# Patient Record
Sex: Male | Born: 1950
Health system: Southern US, Community
[De-identification: ages and names within clinical notes are randomized; demographics above are authoritative.]

## PROBLEM LIST (undated history)

## (undated) DIAGNOSIS — R7303 Prediabetes: Secondary | ICD-10-CM

## (undated) DIAGNOSIS — M1990 Unspecified osteoarthritis, unspecified site: Secondary | ICD-10-CM

## (undated) DIAGNOSIS — K219 Gastro-esophageal reflux disease without esophagitis: Secondary | ICD-10-CM

## (undated) DIAGNOSIS — Z8719 Personal history of other diseases of the digestive system: Secondary | ICD-10-CM

## (undated) DIAGNOSIS — I10 Essential (primary) hypertension: Secondary | ICD-10-CM

## (undated) DIAGNOSIS — I514 Myocarditis, unspecified: Secondary | ICD-10-CM

## (undated) DIAGNOSIS — K635 Polyp of colon: Secondary | ICD-10-CM

## (undated) DIAGNOSIS — K5790 Diverticulosis of intestine, part unspecified, without perforation or abscess without bleeding: Secondary | ICD-10-CM

## (undated) DIAGNOSIS — I219 Acute myocardial infarction, unspecified: Secondary | ICD-10-CM

## (undated) DIAGNOSIS — C801 Malignant (primary) neoplasm, unspecified: Secondary | ICD-10-CM

## (undated) DIAGNOSIS — I340 Nonrheumatic mitral (valve) insufficiency: Secondary | ICD-10-CM

## (undated) HISTORY — PX: TOTAL KNEE ARTHROPLASTY: SHX125

## (undated) HISTORY — PX: ARTHROSCOPY KNEE W/ DRILLING: SUR92

## (undated) HISTORY — PX: CHOLECYSTECTOMY: SHX55

## (undated) HISTORY — PX: CARDIAC CATHETERIZATION: SHX172

## (undated) HISTORY — PX: TONSILLECTOMY: SUR1361

## (undated) HISTORY — PX: OTHER SURGICAL HISTORY: SHX169

## (undated) HISTORY — PX: HERNIA REPAIR: SHX51

---

## 2011-04-08 ENCOUNTER — Emergency Department (HOSPITAL_BASED_OUTPATIENT_CLINIC_OR_DEPARTMENT_OTHER)
Admission: EM | Admit: 2011-04-08 | Discharge: 2011-04-08 | Disposition: A | Discharge status: Home / Self Care | Payer: BC Managed Care – PPO | Attending: Emergency Medicine | Admitting: Emergency Medicine

## 2011-04-08 ENCOUNTER — Emergency Department (INDEPENDENT_AMBULATORY_CARE_PROVIDER_SITE_OTHER): Payer: BC Managed Care – PPO

## 2011-04-08 DIAGNOSIS — K5732 Diverticulitis of large intestine without perforation or abscess without bleeding: Secondary | ICD-10-CM | POA: Insufficient documentation

## 2011-04-08 DIAGNOSIS — R1032 Left lower quadrant pain: Secondary | ICD-10-CM | POA: Insufficient documentation

## 2011-04-08 DIAGNOSIS — I10 Essential (primary) hypertension: Secondary | ICD-10-CM | POA: Insufficient documentation

## 2011-04-08 DIAGNOSIS — Z79899 Other long term (current) drug therapy: Secondary | ICD-10-CM | POA: Insufficient documentation

## 2011-04-08 HISTORY — DX: Essential (primary) hypertension: I10

## 2011-04-08 HISTORY — DX: Diverticulosis of intestine, part unspecified, without perforation or abscess without bleeding: K57.90

## 2011-04-08 HISTORY — DX: Myocarditis, unspecified: I51.4

## 2011-04-08 HISTORY — DX: Polyp of colon: K63.5

## 2011-04-08 LAB — URINALYSIS, ROUTINE W REFLEX MICROSCOPIC
Hgb urine dipstick: NEGATIVE
Specific Gravity, Urine: 1.018 (ref 1.005–1.030)
Urobilinogen, UA: 0.2 mg/dL (ref 0.0–1.0)

## 2011-04-08 LAB — COMPREHENSIVE METABOLIC PANEL
ALT: 23 U/L (ref 0–53)
AST: 19 U/L (ref 0–37)
Albumin: 3.5 g/dL (ref 3.5–5.2)
Calcium: 9.3 mg/dL (ref 8.4–10.5)
GFR calc Af Amer: >90 mL/min (ref >90)
Glucose, Bld: 107 mg/dL — ABNORMAL HIGH (ref 70–99)
Sodium: 137 mEq/L (ref 135–145)
Total Protein: 7.2 g/dL (ref 6.0–8.3)

## 2011-04-08 LAB — CBC
MCV: 79.6 fL (ref 78.0–100.0)
Platelets: 204 10*3/uL (ref 150–400)
RBC: 5.38 MIL/uL (ref 4.22–5.81)
WBC: 6.6 10*3/uL (ref 4.0–10.5)

## 2011-04-08 LAB — DIFFERENTIAL
Eosinophils Relative: 2 % (ref 0–5)
Lymphocytes Relative: 27 % (ref 12–46)
Lymphs Abs: 1.8 10*3/uL (ref 0.7–4.0)
Neutrophils Relative %: 58 % (ref 43–77)

## 2011-04-08 MED ORDER — METRONIDAZOLE 500 MG PO TABS
500.0000 mg | ORAL_TABLET | Freq: Once | ORAL | Status: AC
  Administered 2011-04-08: 500 mg via ORAL
  Filled 2011-04-08: qty 1

## 2011-04-08 MED ORDER — CIPROFLOXACIN HCL 500 MG PO TABS
500.0000 mg | ORAL_TABLET | Freq: Once | ORAL | Status: AC
  Administered 2011-04-08: 500 mg via ORAL
  Filled 2011-04-08: qty 1

## 2011-04-08 MED ORDER — METRONIDAZOLE 500 MG PO TABS: 500.0000 mg | ORAL_TABLET | Freq: Three times a day (TID) | ORAL | Status: AC

## 2011-04-08 MED ORDER — HYDROCODONE-ACETAMINOPHEN 5-325 MG PO TABS: 1.0000 | ORAL_TABLET | Freq: Four times a day (QID) | ORAL | Status: AC | PRN

## 2011-04-08 MED ORDER — CIPROFLOXACIN HCL 500 MG PO TABS: 500.0000 mg | ORAL_TABLET | Freq: Two times a day (BID) | ORAL | Status: AC

## 2011-04-08 MED ORDER — SODIUM CHLORIDE 0.9 % IV SOLN: 999.0000 mL | INTRAVENOUS | Status: DC

## 2011-04-08 NOTE — ED Notes (Signed)
LUQ pain-denies n/v/d,denies urinary s/s-BM "a little bit larger"-last BM this am

## 2011-04-08 NOTE — ED Provider Notes (Signed)
History     CSN: [id number] Arrival date & time: 04/08/2011 11:27 AM   First MD Initiated Contact with Patient 04/08/11 1134      Chief Complaint  Patient presents with  . Abdominal Pain    (Consider location/radiation/quality/duration/timing/severity/associated sxs/prior treatment) Patient is a 60 y.o. male presenting with abdominal pain. The history is provided by the patient.  Abdominal Pain The primary symptoms of the illness include abdominal pain. The primary symptoms of the illness do not include fever, nausea, vomiting, diarrhea or dysuria. The current episode started 2 days ago. The onset of the illness was gradual. The problem has not changed since onset. The abdominal pain is located in the LLQ. The abdominal pain does not radiate. Pain scale: The pain is not present when he is lying still however when he palpates the area  the pain is moderate. Exacerbated by: The pain is not increased with eating.  Symptoms associated with the illness do not include chills, anorexia, diaphoresis, urgency, hematuria, frequency or back pain. Significant associated medical issues do not include inflammatory bowel disease.    Past Medical History  Diagnosis Date  . Hypertension   . Myocarditis     Past Surgical History  Procedure Date  . Hernia repair     No family history on file.  History  Substance Use Topics  . Smoking status: Never Smoker   . Smokeless tobacco: Not on file  . Alcohol Use: No      Review of Systems  Constitutional: Negative for fever, chills and diaphoresis.  Gastrointestinal: Positive for abdominal pain. Negative for nausea, vomiting, diarrhea and anorexia.  Genitourinary: Negative for dysuria, urgency, frequency and hematuria.  Musculoskeletal: Negative for back pain.  All other systems reviewed and are negative.    Allergies  Review of patient's allergies indicates no known allergies.  Home Medications   Current Outpatient Rx  Name Route Sig  Dispense Refill  . ASPIRIN 81 MG PO TABS Oral Take 81 mg by mouth daily.      . ATENOLOL PO Oral Take by mouth.      Marland Kitchen CALCIUM & MAGNESIUM CARBONATES 311-232 MG PO TABS Oral Take 1 tablet by mouth daily.      Marland Kitchen DIAZEPAM PO Oral Take by mouth.      . OMEGA-3 FATTY ACIDS 1000 MG PO CAPS Oral Take 2 g by mouth daily.      . MULTIVITAMIN PO Oral Take by mouth.      . OMEPRAZOLE PO Oral Take by mouth.      . ALTACE PO Oral Take by mouth.      Marland Kitchen VITAMIN B-2 PO Oral Take by mouth.      Marland Kitchen SIMVASTATIN PO Oral Take by mouth.        BP 135/86  Pulse 78  Temp(Src) 98.5 F (36.9 C) (Oral)  Resp 20  Ht 6\' 1"  (1.854 m)  Wt 205 lb (92.987 kg)  BMI 27.05 kg/m2  SpO2 96%  Physical Exam  Nursing note and vitals reviewed. Constitutional: He appears well-developed and well-nourished. No distress.  HENT:  Head: Normocephalic and atraumatic.  Right Ear: External ear normal.  Left Ear: External ear normal.  Eyes: Conjunctivae are normal. Right eye exhibits no discharge. Left eye exhibits no discharge. No scleral icterus.  Neck: Neck supple. No tracheal deviation present.  Cardiovascular: Normal rate, regular rhythm and intact distal pulses.   Pulmonary/Chest: Effort normal and breath sounds normal. No stridor. No respiratory distress. He has no wheezes.  He has no rales.  Abdominal: Soft. Bowel sounds are normal. He exhibits no distension, no fluid wave, no abdominal bruit, no ascites, no pulsatile midline mass and no mass. There is tenderness in the left lower quadrant. There is no rigidity, no rebound, no guarding and no CVA tenderness. No hernia.    Musculoskeletal: He exhibits no edema and no tenderness.  Neurological: He is alert. He has normal strength. No sensory deficit. Cranial nerve deficit:  no gross defecits noted. He exhibits normal muscle tone. He displays no seizure activity. Coordination normal.  Skin: Skin is warm and dry. No rash noted.  Psychiatric: He has a normal mood and affect.      ED Course  Procedures (including critical care time)  Labs Reviewed  COMPREHENSIVE METABOLIC PANEL - Abnormal; Notable for the following:    Glucose, Bld 107 (*)    All other components within normal limits  LIPASE, BLOOD  URINALYSIS, ROUTINE W REFLEX MICROSCOPIC  CBC  DIFFERENTIAL   Ct Abdomen Pelvis Wo Contrast  04/08/2011  *RADIOLOGY REPORT*  Clinical Data: Left lower quadrant pain.  CT ABDOMEN AND PELVIS WITHOUT CONTRAST  Technique:  Multidetector CT imaging of the abdomen and pelvis was performed following the standard protocol without intravenous contrast.  Comparison: none  .  Findings: Mild atelectasis at the left lung base.  No pericardial fluid.  Non-IV contrast images demonstrate no focal hepatic lesion. Gallbladder, pancreas, spleen, adrenal glands, and kidneys are normal.   No ureterolithiasis.  The stomach, small bowel, appendix are normal.  There is pericolonic stranding surrounding the descending colon over a short 6 cm segment (image 55).  There are several diverticula in this region.  Abdominal aorta is normal caliber.  No retroperitoneal lymphadenopathy.  No free fluid the pelvis.  The bladder is normal.  Prostate gland is enlarged.  No pelvic lymphadenopathy. Review of  bone windows demonstrates no aggressive osseous lesions  IMPRESSION:  1.  Findings most consistent with acute non complicated diverticulitis of the descending colon. 2.  Recommend follow up imaging (or screening colonoscopy if not current) to rule out unlikely underlying malignancy.  Original Report Authenticated By: Genevive Bi, M.D.     1. Diverticulitis of colon       MDM  1:38 PM The patient still having persistent pain in the left lower abdomen. He does have a history of diverticulosis without diverticulitis. Renal colic is a possibility as well. A noncontrast CT abdomen pelvis has been ordered for further evaluation  CT scan result suggests uncomplicated diverticulitis. The patient is  comfortable in no acute distress at this time. We'll discharge him home on a course of Cipro and Flagyl.        Celene Kras, MD 04/08/11 [phone]

## 2011-08-06 ENCOUNTER — Encounter: Payer: BC Managed Care – PPO | Admitting: Family Medicine

## 2011-08-06 DIAGNOSIS — Z0289 Encounter for other administrative examinations: Secondary | ICD-10-CM

## 2013-11-09 ENCOUNTER — Emergency Department
Admission: EM | Admit: 2013-11-09 | Discharge: 2013-11-09 | Payer: BC Managed Care – PPO | Source: Home / Self Care | Attending: Emergency Medicine | Admitting: Emergency Medicine

## 2013-11-09 ENCOUNTER — Encounter: Payer: Self-pay | Admitting: Emergency Medicine

## 2013-11-09 DIAGNOSIS — L723 Sebaceous cyst: Secondary | ICD-10-CM

## 2013-11-09 DIAGNOSIS — L089 Local infection of the skin and subcutaneous tissue, unspecified: Secondary | ICD-10-CM

## 2013-11-09 NOTE — ED Provider Notes (Signed)
CSN: [id number]     Arrival date & time 11/09/13  1329 History   First MD Initiated Contact with Patient 11/09/13 1357     Chief Complaint  Patient presents with  . Cyst    back   (Consider location/radiation/quality/duration/timing/severity/associated sxs/prior Treatment) HPI Pt has had cysts on his back for years. In the last 3-4 weeks one has joined another and it has become red and bruised swollen and painful on his upper back. Pt has been taking Doxy for 8 days and has no relief.  Past Medical History  Diagnosis Date  . Hypertension   . Myocarditis   . Diverticulosis   . Colon polyps    Past Surgical History  Procedure Laterality Date  . Hernia repair    . Gynomastia     History reviewed. No pertinent family history. History  Substance Use Topics  . Smoking status: Never Smoker   . Smokeless tobacco: Not on file  . Alcohol Use: No    Review of Systems  Allergies  Review of patient's allergies indicates no known allergies.  Home Medications   Prior to Admission medications   Medication Sig Start Date End Date Taking? Authorizing Provider  aspirin 81 MG tablet Take 81 mg by mouth daily.      Historical Provider, MD  ATENOLOL PO Take by mouth.      Historical Provider, MD  calcium & magnesium carbonates (MYLANTA) 311-232 MG per tablet Take 1 tablet by mouth daily.      Historical Provider, MD  DIAZEPAM PO Take by mouth.      Historical Provider, MD  fish oil-omega-3 fatty acids 1000 MG capsule Take 2 g by mouth daily.      Historical Provider, MD  Multiple Vitamin (MULTIVITAMIN PO) Take by mouth.      Historical Provider, MD  OMEPRAZOLE PO Take by mouth.      Historical Provider, MD  Ramipril (ALTACE PO) Take by mouth.      Historical Provider, MD  Riboflavin (VITAMIN B-2 PO) Take by mouth.      Historical Provider, MD  SIMVASTATIN PO Take by mouth.      Historical Provider, MD   BP 103/68  Pulse 76  Temp(Src) 98.8 F (37.1 C) (Oral)  Ht 5\' 11"  (1.803 m)  Wt  200 lb 4 oz (90.833 kg)  BMI 27.94 kg/m2  SpO2 96% Physical Exam 10 x 10 cm acute, swollen, red, tender sebaceous cyst mid back. It extends very deep. ED Course  Procedures (including critical care time) Labs Review Labs Reviewed - No data to display  Imaging Review No results found.   MDM   1. Infected sebaceous cyst of skin    Huge infected sebaceous cyst mid back. In my medical opinion, this is beyond my scope of surgery, because of the huge size of a sebaceous cyst which extends very deep.  Explained to patient and wife. We arranged for stat referral work in appointment now with Dr Eustaquio Maize Talent at [facility]. (No charge visit here)    Jacqulyn Cane, MD 11/09/13 1427

## 2013-11-09 NOTE — ED Notes (Signed)
Pt has had cysts on his back for years.  In the last 3-4 weeks one has joined another and it has become red and bruised swollen and painful on his upper back.  Pt has been taking Doxy for 8 days and has no relief.

## 2016-08-06 ENCOUNTER — Ambulatory Visit: Payer: Self-pay | Admitting: Surgery

## 2016-08-06 NOTE — H&P (Signed)
Shane Shane Cruz 08/06/2016 11:09 AM Location: [facility] Patient #: [id number] DOB: [birth year] Married / Language: Shane Shane Cruz / Race: White Male  History of Present Illness (Shane Shane Cruz A. Kae Heller MD; 08/06/2016 11:16 AM) Patient words: This is a very nice 66 year old gentleman with a long-standing history of multiple sebaceous cysts. He's got 4 on his back. One of them became inflamed and had to be lanced in 2015. Other than that he's had no procedures for them. They do cause some discomfort. No drainage.  The patient is a 66 year old male.   Past Surgical History Shane Shane Cruz, Shane Shane Cruz; 08/06/2016 11:09 AM) Colon Polyp Removal - Colonoscopy Colon Polyp Removal - Open Open Inguinal Hernia Surgery Right. Oral Surgery  Diagnostic Studies History Shane Shane Cruz, [place]; 08/06/2016 11:09 AM) Colonoscopy 1-5 years ago  Allergies Shane Shane Cruz, Cruz; 08/06/2016 11:12 AM) No Known Drug Allergies 08/06/2016  Medication History (Shane Shane Cruz, Cruz; 08/06/2016 11:14 AM) TraZODone HCl (100MG  Tablet, Oral) Active. Simvastatin (40MG  Tablet, Oral) Active. Ramipril (10MG  Capsule, Oral) Active. Omeprazole (20MG  Capsule DR, Oral) Active. Atenolol (100MG  Tablet, Oral) Active. Aspirin (81MG  Tablet, Oral) Active. Fish Oil (1000MG  Capsule, Oral) Active. Multivitamin Adult (Oral) Active. Medications Reconciled  Social History Shane Shane Cruz, Cruz; 08/06/2016 11:09 AM) Alcohol use Remotely quit alcohol use. Caffeine use Coffee. No drug use Tobacco use Former smoker.  Family History Shane Shane Cruz, Shane Shane Cruz; 08/06/2016 11:09 AM) Arthritis Father, Mother. Colon Cancer Father. Heart Disease Father. Hypertension Father.  Other Problems Shane Shane Cruz, Cruz; 08/06/2016 11:09 AM) Arthritis Gastroesophageal Reflux Disease High blood pressure Hypercholesterolemia Inguinal Hernia     Review of Systems (Shane Shane Cruz; 08/06/2016 11:09 AM) General Not Present- Appetite Loss, Chills, Fatigue, Fever,  Night Sweats, Weight Gain and Weight Loss. Skin Not Present- Change in Wart/Mole, Dryness, Hives, Jaundice, New Lesions, Non-Healing Wounds, Rash and Ulcer. HEENT Present- Ringing in the Ears and Wears glasses/contact lenses. Not Present- Earache, Hearing Loss, Hoarseness, Nose Bleed, Oral Ulcers, Seasonal Allergies, Sinus Pain, Sore Throat, Visual Disturbances and Yellow Eyes. Respiratory Not Present- Bloody sputum, Chronic Cough, Difficulty Breathing, Snoring and Wheezing. Breast Not Present- Breast Mass, Breast Pain, Nipple Discharge and Skin Changes. Cardiovascular Not Present- Chest Pain, Difficulty Breathing Lying Down, Leg Cramps, Palpitations, Rapid Heart Rate, Shortness of Breath and Swelling of Extremities. Gastrointestinal Not Present- Abdominal Pain, Bloating, Bloody Stool, Change in Bowel Habits, Chronic diarrhea, Constipation, Difficulty Swallowing, Excessive gas, Gets full quickly at meals, Hemorrhoids, Indigestion, Nausea, Rectal Pain and Vomiting. Male Genitourinary Not Present- Blood in Urine, Change in Urinary Stream, Frequency, Impotence, Nocturia, Painful Urination, Urgency and Urine Leakage. Musculoskeletal Not Present- Back Pain, Joint Pain, Joint Stiffness, Muscle Pain, Muscle Weakness and Swelling of Extremities. Neurological Not Present- Decreased Memory, Fainting, Headaches, Numbness, Seizures, Tingling, Tremor, Trouble walking and Weakness. Psychiatric Not Present- Anxiety, Bipolar, Change in Sleep Pattern, Depression, Fearful and Frequent crying. Endocrine Not Present- Cold Intolerance, Excessive Hunger, Hair Changes, Heat Intolerance, Hot flashes and New Diabetes. Hematology Not Present- Blood Thinners, Easy Bruising, Excessive bleeding, Gland problems, HIV and Persistent Infections.  Vitals (Shane Shane Cruz; 08/06/2016 11:12 AM) 08/06/2016 11:10 AM Weight: 212.5 lb Height: 72in Body Surface Area: 2.19 m Body Mass Index: 28.82 kg/m  Temp.: 98.62F  Pulse: 76  (Regular)  P.OX: 96% (Room air) BP: 110/68 (Sitting, Left Arm, Standard)      Physical Exam (Shane Shane Cruz A. Kae Heller MD; 08/06/2016 11:18 AM)  General Note: Alert and oriented, well-appearing  Integumentary Note: There are 4 sebaceous cysts along the midline of his back, the largest is  about 4 cm in diameter and the smallest is about one cm diameter  Head and Neck Note: No mass or thyromegaly  Eye Note: Anicteric, extraocular motion intact  Chest and Lung Exam Note: unlabored respirations, symmetrical air entry  Neurologic Note: grossly intact, normal gait  Neuropsychiatric Note: normal mood and affect, appropriate insight    Assessment & Plan (Shane Shane Cruz A. Shane Riches MD; 08/06/2016 11:20 AM)  SEBACEOUS CYST (L72.3) Story: He actually has 4 of them starting at his upper back and going down the midline to his low thoracic region. The largest is about 4 cm in diameter. I recommend that these be excised in 1 trip to the operating room with sedation especially given the size, to minimize the chances of recurrence and maximize the efficiency of the procedures. He would like to know about the financial aspect of this, so I have referred him to our scheduling department to discuss that.

## 2016-12-23 ENCOUNTER — Ambulatory Visit: Payer: Self-pay | Admitting: Surgery

## 2017-01-20 ENCOUNTER — Encounter (HOSPITAL_COMMUNITY)
Admission: RE | Admit: 2017-01-20 | Discharge: 2017-01-20 | Disposition: A | Discharge status: Home / Self Care | Payer: BLUE CROSS/BLUE SHIELD | Source: Ambulatory Visit | Attending: Surgery | Admitting: Surgery

## 2017-01-20 ENCOUNTER — Encounter (HOSPITAL_COMMUNITY): Payer: Self-pay

## 2017-01-20 DIAGNOSIS — K219 Gastro-esophageal reflux disease without esophagitis: Secondary | ICD-10-CM | POA: Diagnosis not present

## 2017-01-20 DIAGNOSIS — L720 Epidermal cyst: Secondary | ICD-10-CM | POA: Diagnosis not present

## 2017-01-20 DIAGNOSIS — Z87891 Personal history of nicotine dependence: Secondary | ICD-10-CM | POA: Diagnosis not present

## 2017-01-20 DIAGNOSIS — Z7982 Long term (current) use of aspirin: Secondary | ICD-10-CM | POA: Diagnosis not present

## 2017-01-20 DIAGNOSIS — E7800 Pure hypercholesterolemia, unspecified: Secondary | ICD-10-CM | POA: Diagnosis not present

## 2017-01-20 DIAGNOSIS — L723 Sebaceous cyst: Secondary | ICD-10-CM | POA: Diagnosis present

## 2017-01-20 DIAGNOSIS — Z79899 Other long term (current) drug therapy: Secondary | ICD-10-CM | POA: Diagnosis not present

## 2017-01-20 DIAGNOSIS — I10 Essential (primary) hypertension: Secondary | ICD-10-CM | POA: Diagnosis not present

## 2017-01-20 HISTORY — DX: Gastro-esophageal reflux disease without esophagitis: K21.9

## 2017-01-20 HISTORY — DX: Nonrheumatic mitral (valve) insufficiency: I34.0

## 2017-01-20 HISTORY — DX: Personal history of other diseases of the digestive system: Z87.19

## 2017-01-20 HISTORY — DX: Unspecified osteoarthritis, unspecified site: M19.90

## 2017-01-20 LAB — BASIC METABOLIC PANEL
Anion gap: 9 (ref 5–15)
BUN: 21 mg/dL — AB (ref 6–20)
CALCIUM: 9.3 mg/dL (ref 8.9–10.3)
CO2: 26 mmol/L (ref 22–32)
CREATININE: 0.99 mg/dL (ref 0.61–1.24)
Chloride: 101 mmol/L (ref 101–111)
GFR calc non Af Amer: >60 mL/min (ref >60)
Glucose, Bld: 123 mg/dL — ABNORMAL HIGH (ref 65–99)
Potassium: 3.6 mmol/L (ref 3.5–5.1)
SODIUM: 136 mmol/L (ref 135–145)

## 2017-01-20 LAB — CBC WITH DIFFERENTIAL/PLATELET
BASOS PCT: 0 %
Basophils Absolute: 0 10*3/uL (ref 0.0–0.1)
EOS ABS: 0.2 10*3/uL (ref 0.0–0.7)
EOS PCT: 4 %
HCT: 42.7 % (ref 39.0–52.0)
HEMOGLOBIN: 14.1 g/dL (ref 13.0–17.0)
Lymphocytes Relative: 27 %
Lymphs Abs: 1.5 10*3/uL (ref 0.7–4.0)
MCH: 26.4 pg (ref 26.0–34.0)
MCHC: 33 g/dL (ref 30.0–36.0)
MCV: 79.8 fL (ref 78.0–100.0)
MONOS PCT: 9 %
Monocytes Absolute: 0.5 10*3/uL (ref 0.1–1.0)
NEUTROS PCT: 60 %
Neutro Abs: 3.4 10*3/uL (ref 1.7–7.7)
PLATELETS: 177 10*3/uL (ref 150–400)
RBC: 5.35 MIL/uL (ref 4.22–5.81)
RDW: 14.7 % (ref 11.5–15.5)
WBC: 5.7 10*3/uL (ref 4.0–10.5)

## 2017-01-20 NOTE — Progress Notes (Signed)
Last cardiology visit 04-29-2018

## 2017-01-20 NOTE — Progress Notes (Signed)
Requested ECHO done 05-20-2015 from Dr. Rennis Golden ,[place]

## 2017-01-20 NOTE — Pre-Procedure Instructions (Signed)
Shane Cruz  01/20/2017      [id number] - [place], [address] [place] [address] Phone: [phone] Fax: [phone]    Your procedure is scheduled on August 24,2018  Friday .  Report to [facility] Admitting at 8:00 A.M.   Call this number if you have problems the morning of surgery:  [phone]   Remember:  Do not eat food or drink liquids after midnight.   Take these medicines the morning of surgery with A SIP OF WATER Atenolol(Tenormin),Omeprazole(Prilosec)  STOP ASPIRIN,ANTIINFLAMATORIES (IBUPROFEN,ALEVE,MOTRIN,ADVIL,GOODY'S POWDERS),HERBAL SUPPLEMENTS,FISH OIL,AND VITAMINS 5-7 DAYS PRIOR TO SURGERY   Do not wear jewelry  Do not wear lotions, powders, or perfumes, or deoderant.  Do not shave 48 hours prior to surgery.  Men may shave face and neck.   Do not bring valuables to the hospital.  [facility] is not responsible for any belongings or valuables.  Contacts, dentures or bridgework may not be worn into surgery.  Leave your suitcase in the car.  After surgery it may be brought to your room.  For patients admitted to the hospital, discharge time will be determined by your treatment team.  Patients discharged the day of surgery will not be allowed to drive home.    Special Instructions: [facility] [place] - Preparing for Surgery  Before surgery, you can play an important role.  Because skin is not sterile, your skin needs to be as free of germs as possible.  You can reduce the number of germs on you skin by washing with CHG (chlorahexidine gluconate) soap before surgery.  CHG is an antiseptic cleaner which kills germs and bonds with the skin to continue killing germs even after washing.  Please DO NOT use if you have an allergy to CHG or antibacterial soaps.  If your skin becomes reddened/irritated stop using the CHG and inform your nurse when you arrive at Short Stay.  Do not shave (including  legs and underarms) for at least 48 hours prior to the first CHG shower.  You may shave your face.  Please follow these instructions carefully:   1.  Shower with CHG Soap the night before surgery and the   morning of Surgery.  2.  If you choose to wash your hair, wash your hair first as usual with your normal shampoo.  3.  After you shampoo, rinse your hair and body thoroughly to remove the  Shampoo.  4.  Use CHG as you would any other liquid soap.  You can apply chg directly  to the skin and wash gently with scrungie or a clean washcloth.  5.  Apply the CHG Soap to your body ONLY FROM THE NECK DOWN.   Do not use on open wounds or open sores.  Avoid contact with your eyes,  ears, mouth and genitals (private parts).  Wash genitals (private parts) with your normal soap.  6.  Wash thoroughly, paying special attention to the area where your surgery will be performed.  7.  Thoroughly rinse your body with warm water from the neck down.  8.  DO NOT shower/wash with your normal soap after using and rinsing o  the CHG Soap.  9.  Pat yourself dry with a clean towel.            10.  Wear clean pajamas.            11.  Place clean sheets on your bed the night  of your first shower and do not sleep with pets.  Day of Surgery  Do not apply any lotions/deodorants the morning of surgery.  Please wear clean clothes to the hospital/surgery center.   Please read over the following fact sheets that you were given. Surgical Site Infection Prevention

## 2017-01-22 ENCOUNTER — Encounter (HOSPITAL_COMMUNITY)
Admission: RE | Disposition: A | Discharge status: Home / Self Care | Payer: Self-pay | Source: Ambulatory Visit | Attending: Surgery

## 2017-01-22 ENCOUNTER — Ambulatory Visit (HOSPITAL_COMMUNITY): Payer: BLUE CROSS/BLUE SHIELD | Admitting: Emergency Medicine

## 2017-01-22 ENCOUNTER — Encounter (HOSPITAL_COMMUNITY): Payer: Self-pay | Admitting: Anesthesiology

## 2017-01-22 ENCOUNTER — Ambulatory Visit (HOSPITAL_COMMUNITY)
Admission: RE | Admit: 2017-01-22 | Discharge: 2017-01-22 | Disposition: A | Discharge status: Home / Self Care | Payer: BLUE CROSS/BLUE SHIELD | Source: Ambulatory Visit | Attending: Surgery | Admitting: Surgery

## 2017-01-22 ENCOUNTER — Ambulatory Visit (HOSPITAL_COMMUNITY): Payer: BLUE CROSS/BLUE SHIELD | Admitting: Anesthesiology

## 2017-01-22 DIAGNOSIS — E7800 Pure hypercholesterolemia, unspecified: Secondary | ICD-10-CM | POA: Insufficient documentation

## 2017-01-22 DIAGNOSIS — Z79899 Other long term (current) drug therapy: Secondary | ICD-10-CM | POA: Insufficient documentation

## 2017-01-22 DIAGNOSIS — I10 Essential (primary) hypertension: Secondary | ICD-10-CM | POA: Insufficient documentation

## 2017-01-22 DIAGNOSIS — L720 Epidermal cyst: Secondary | ICD-10-CM | POA: Insufficient documentation

## 2017-01-22 DIAGNOSIS — Z7982 Long term (current) use of aspirin: Secondary | ICD-10-CM | POA: Insufficient documentation

## 2017-01-22 DIAGNOSIS — Z87891 Personal history of nicotine dependence: Secondary | ICD-10-CM | POA: Insufficient documentation

## 2017-01-22 DIAGNOSIS — K219 Gastro-esophageal reflux disease without esophagitis: Secondary | ICD-10-CM | POA: Insufficient documentation

## 2017-01-22 HISTORY — PX: LIPOMA EXCISION: SHX5283

## 2017-01-22 SURGERY — EXCISION LIPOMA
Anesthesia: General | Site: Back

## 2017-01-22 MED ORDER — OXYCODONE HCL 5 MG PO TABS: 5.0000 mg | ORAL_TABLET | Freq: Once | ORAL | Status: DC | PRN

## 2017-01-22 MED ORDER — FENTANYL CITRATE (PF) 100 MCG/2ML IJ SOLN: 25.0000 ug | INTRAMUSCULAR | Status: DC | PRN

## 2017-01-22 MED ORDER — OXYCODONE HCL 5 MG/5ML PO SOLN: 5.0000 mg | Freq: Once | ORAL | Status: DC | PRN

## 2017-01-22 MED ORDER — SODIUM CHLORIDE 0.9% FLUSH: 3.0000 mL | Freq: Two times a day (BID) | INTRAVENOUS | Status: DC

## 2017-01-22 MED ORDER — CHLORHEXIDINE GLUCONATE 4 % EX LIQD: 60.0000 mL | Freq: Once | CUTANEOUS | Status: DC

## 2017-01-22 MED ORDER — ROCURONIUM BROMIDE 10 MG/ML (PF) SYRINGE
PREFILLED_SYRINGE | INTRAVENOUS | Status: AC
  Filled 2017-01-22: qty 5

## 2017-01-22 MED ORDER — MIDAZOLAM HCL 5 MG/5ML IJ SOLN
INTRAMUSCULAR | Status: DC | PRN
  Administered 2017-01-22: 2 mg via INTRAVENOUS

## 2017-01-22 MED ORDER — FENTANYL CITRATE (PF) 250 MCG/5ML IJ SOLN
INTRAMUSCULAR | Status: AC
  Filled 2017-01-22: qty 5

## 2017-01-22 MED ORDER — LACTATED RINGERS IV SOLN
INTRAVENOUS | Status: DC
  Administered 2017-01-22 (×2): via INTRAVENOUS

## 2017-01-22 MED ORDER — EPHEDRINE SULFATE 50 MG/ML IJ SOLN
INTRAMUSCULAR | Status: DC | PRN
  Administered 2017-01-22 (×3): 5 mg via INTRAVENOUS

## 2017-01-22 MED ORDER — ONDANSETRON HCL 4 MG/2ML IJ SOLN
INTRAMUSCULAR | Status: AC
  Filled 2017-01-22: qty 2

## 2017-01-22 MED ORDER — LIDOCAINE HCL (CARDIAC) 20 MG/ML IV SOLN
INTRAVENOUS | Status: DC | PRN
  Administered 2017-01-22: 40 mg via INTRAVENOUS

## 2017-01-22 MED ORDER — MIDAZOLAM HCL 2 MG/2ML IJ SOLN
INTRAMUSCULAR | Status: AC
  Filled 2017-01-22: qty 2

## 2017-01-22 MED ORDER — 0.9 % SODIUM CHLORIDE (POUR BTL) OPTIME
TOPICAL | Status: DC | PRN
  Administered 2017-01-22: 1000 mL

## 2017-01-22 MED ORDER — PROPOFOL 10 MG/ML IV BOLUS
INTRAVENOUS | Status: DC | PRN
  Administered 2017-01-22: 200 mg via INTRAVENOUS

## 2017-01-22 MED ORDER — ROCURONIUM BROMIDE 100 MG/10ML IV SOLN
INTRAVENOUS | Status: DC | PRN
  Administered 2017-01-22: 50 mg via INTRAVENOUS

## 2017-01-22 MED ORDER — SODIUM CHLORIDE 0.9% FLUSH: 3.0000 mL | INTRAVENOUS | Status: DC | PRN

## 2017-01-22 MED ORDER — SUGAMMADEX SODIUM 200 MG/2ML IV SOLN
INTRAVENOUS | Status: AC
  Filled 2017-01-22: qty 2

## 2017-01-22 MED ORDER — LACTATED RINGERS IV SOLN: INTRAVENOUS | Status: DC

## 2017-01-22 MED ORDER — ONDANSETRON HCL 4 MG/2ML IJ SOLN: 4.0000 mg | Freq: Once | INTRAMUSCULAR | Status: DC | PRN

## 2017-01-22 MED ORDER — OXYCODONE HCL 5 MG PO TABS: 5.0000 mg | ORAL_TABLET | ORAL | Status: DC | PRN

## 2017-01-22 MED ORDER — ONDANSETRON HCL 4 MG/2ML IJ SOLN
INTRAMUSCULAR | Status: DC | PRN
  Administered 2017-01-22: 4 mg via INTRAVENOUS

## 2017-01-22 MED ORDER — LIDOCAINE 2% (20 MG/ML) 5 ML SYRINGE
INTRAMUSCULAR | Status: AC
  Filled 2017-01-22: qty 5

## 2017-01-22 MED ORDER — GABAPENTIN 300 MG PO CAPS
300.0000 mg | ORAL_CAPSULE | ORAL | Status: AC
  Administered 2017-01-22: 300 mg via ORAL
  Filled 2017-01-22: qty 1

## 2017-01-22 MED ORDER — DOCUSATE SODIUM 100 MG PO CAPS: 100.0000 mg | ORAL_CAPSULE | Freq: Two times a day (BID) | ORAL | 0 refills | Status: AC

## 2017-01-22 MED ORDER — ACETAMINOPHEN 325 MG PO TABS: 650.0000 mg | ORAL_TABLET | ORAL | Status: DC | PRN

## 2017-01-22 MED ORDER — HYDROCODONE-ACETAMINOPHEN 5-325 MG PO TABS: 1.0000 | ORAL_TABLET | Freq: Four times a day (QID) | ORAL | 0 refills | Status: DC | PRN

## 2017-01-22 MED ORDER — DEXAMETHASONE SODIUM PHOSPHATE 10 MG/ML IJ SOLN
INTRAMUSCULAR | Status: AC
  Filled 2017-01-22: qty 1

## 2017-01-22 MED ORDER — DEXAMETHASONE SODIUM PHOSPHATE 10 MG/ML IJ SOLN
INTRAMUSCULAR | Status: DC | PRN
  Administered 2017-01-22: 10 mg via INTRAVENOUS

## 2017-01-22 MED ORDER — SODIUM CHLORIDE 0.9 % IV SOLN: 250.0000 mL | INTRAVENOUS | Status: DC | PRN

## 2017-01-22 MED ORDER — FENTANYL CITRATE (PF) 100 MCG/2ML IJ SOLN
INTRAMUSCULAR | Status: DC | PRN
  Administered 2017-01-22 (×3): 50 ug via INTRAVENOUS

## 2017-01-22 MED ORDER — PROPOFOL 10 MG/ML IV BOLUS
INTRAVENOUS | Status: AC
  Filled 2017-01-22: qty 20

## 2017-01-22 MED ORDER — EPHEDRINE 5 MG/ML INJ
INTRAVENOUS | Status: AC
  Filled 2017-01-22: qty 10

## 2017-01-22 MED ORDER — ACETAMINOPHEN 500 MG PO TABS
1000.0000 mg | ORAL_TABLET | ORAL | Status: AC
  Administered 2017-01-22: 1000 mg via ORAL
  Filled 2017-01-22: qty 2

## 2017-01-22 MED ORDER — SUGAMMADEX SODIUM 200 MG/2ML IV SOLN
INTRAVENOUS | Status: DC | PRN
  Administered 2017-01-22: 200 mg via INTRAVENOUS

## 2017-01-22 MED ORDER — BUPIVACAINE-EPINEPHRINE (PF) 0.25% -1:200000 IJ SOLN
INTRAMUSCULAR | Status: AC
  Filled 2017-01-22: qty 30

## 2017-01-22 MED ORDER — BUPIVACAINE-EPINEPHRINE 0.25% -1:200000 IJ SOLN
INTRAMUSCULAR | Status: DC | PRN
  Administered 2017-01-22: 10 mL

## 2017-01-22 MED ORDER — CEFAZOLIN SODIUM-DEXTROSE 2-4 GM/100ML-% IV SOLN
2.0000 g | INTRAVENOUS | Status: AC
  Administered 2017-01-22: 2 g via INTRAVENOUS
  Filled 2017-01-22: qty 100

## 2017-01-22 MED ORDER — ACETAMINOPHEN 650 MG RE SUPP: 650.0000 mg | RECTAL | Status: DC | PRN

## 2017-01-22 SURGICAL SUPPLY — 38 items
CANISTER SUCT 3000ML PPV (MISCELLANEOUS) IMPLANT
COVER SURGICAL LIGHT HANDLE (MISCELLANEOUS) ×3 IMPLANT
DECANTER SPIKE VIAL GLASS SM (MISCELLANEOUS) IMPLANT
DERMABOND ADVANCED (GAUZE/BANDAGES/DRESSINGS)
DERMABOND ADVANCED .7 DNX12 (GAUZE/BANDAGES/DRESSINGS) IMPLANT
DRAPE LAPAROSCOPIC ABDOMINAL (DRAPES) ×3 IMPLANT
DRAPE LAPAROTOMY 100X72 PEDS (DRAPES) IMPLANT
DRAPE UTILITY XL STRL (DRAPES) ×3 IMPLANT
ELECT CAUTERY BLADE 6.4 (BLADE) ×3 IMPLANT
ELECT REM PT RETURN 9FT ADLT (ELECTROSURGICAL) ×3
ELECTRODE REM PT RTRN 9FT ADLT (ELECTROSURGICAL) ×1 IMPLANT
GAUZE SPONGE 4X4 12PLY STRL (GAUZE/BANDAGES/DRESSINGS) ×3 IMPLANT
GLOVE BIO SURGEON STRL SZ 6 (GLOVE) ×3 IMPLANT
GLOVE BIOGEL PI IND STRL 6.5 (GLOVE) ×1 IMPLANT
GLOVE BIOGEL PI INDICATOR 6.5 (GLOVE) ×2
GOWN STRL REUS W/ TWL LRG LVL3 (GOWN DISPOSABLE) ×1 IMPLANT
GOWN STRL REUS W/TWL LRG LVL3 (GOWN DISPOSABLE) ×2
KIT BASIN OR (CUSTOM PROCEDURE TRAY) ×3 IMPLANT
KIT ROOM TURNOVER OR (KITS) ×3 IMPLANT
NEEDLE HYPO 25GX1X1/2 BEV (NEEDLE) ×3 IMPLANT
NS IRRIG 1000ML POUR BTL (IV SOLUTION) ×3 IMPLANT
PACK SURGICAL SETUP 50X90 (CUSTOM PROCEDURE TRAY) ×3 IMPLANT
PAD ARMBOARD 7.5X6 YLW CONV (MISCELLANEOUS) ×3 IMPLANT
PENCIL BUTTON HOLSTER BLD 10FT (ELECTRODE) ×3 IMPLANT
SPECIMEN JAR MEDIUM (MISCELLANEOUS) ×3 IMPLANT
SPONGE LAP 18X18 X RAY DECT (DISPOSABLE) ×3 IMPLANT
SUT ETHILON 2 0 FS 18 (SUTURE) ×9 IMPLANT
SUT MNCRL AB 4-0 PS2 18 (SUTURE) ×3 IMPLANT
SUT VIC AB 3-0 SH 27 (SUTURE) ×4
SUT VIC AB 3-0 SH 27XBRD (SUTURE) ×2 IMPLANT
SYR BULB 3OZ (MISCELLANEOUS) ×3 IMPLANT
SYR CONTROL 10ML LL (SYRINGE) ×3 IMPLANT
TAPE CLOTH SURG 4X10 WHT LF (GAUZE/BANDAGES/DRESSINGS) ×3 IMPLANT
TOWEL OR 17X24 6PK STRL BLUE (TOWEL DISPOSABLE) ×3 IMPLANT
TOWEL OR 17X26 10 PK STRL BLUE (TOWEL DISPOSABLE) IMPLANT
TUBE CONNECTING 12'X1/4 (SUCTIONS) ×1
TUBE CONNECTING 12X1/4 (SUCTIONS) ×2 IMPLANT
YANKAUER SUCT BULB TIP NO VENT (SUCTIONS) ×3 IMPLANT

## 2017-01-22 NOTE — Anesthesia Postprocedure Evaluation (Signed)
Anesthesia Post Note  Patient: Shane Cruz  Procedure(s) Performed: Procedure(s) (LRB): EXCISION OF SEBACEOUS CYST X 4 FROM BACK (N/A)     Patient location during evaluation: PACU Anesthesia Type: General Level of consciousness: awake, awake and alert and oriented Pain management: pain level controlled Vital Signs Assessment: post-procedure vital signs reviewed and stable Respiratory status: spontaneous breathing, nonlabored ventilation and respiratory function stable Cardiovascular status: blood pressure returned to baseline Anesthetic complications: no    Last Vitals:  Vitals:   01/22/17 0824 01/22/17 1145  BP: 129/83 121/78  Pulse: 65 74  Resp: 19 (!) 22  Temp: 36.6 C 36.4 C  SpO2: 99% 98%    Last Pain:  Vitals:   01/22/17 0824  TempSrc: Oral                 Banessa Mao COKER

## 2017-01-22 NOTE — Anesthesia Procedure Notes (Signed)
Procedure Name: Intubation Date/Time: 01/22/2017 10:20 AM Performed by: Jenne Campus Pre-anesthesia Checklist: Patient identified, Emergency Drugs available, Suction available and Patient being monitored Patient Re-evaluated:Patient Re-evaluated prior to induction Oxygen Delivery Method: Circle System Utilized Preoxygenation: Pre-oxygenation with 100% oxygen Induction Type: IV induction Ventilation: Mask ventilation without difficulty Laryngoscope Size: Miller and 3 Grade View: Grade I Tube type: Oral Tube size: 7.5 mm Number of attempts: 1 Airway Equipment and Method: Stylet and Oral airway Placement Confirmation: ETT inserted through vocal cords under direct vision,  positive ETCO2 and breath sounds checked- equal and bilateral Secured at: 21 cm Tube secured with: Tape Dental Injury: Teeth and Oropharynx as per pre-operative assessment

## 2017-01-22 NOTE — Addendum Note (Signed)
Addendum  created 01/22/17 1223 by Roderic Palau, MD   Anesthesia Attestations deleted

## 2017-01-22 NOTE — Discharge Instructions (Signed)
GENERAL SURGERY: POST OP INSTRUCTIONS ° °###################################################################### ° °EAT °Gradually transition to a high fiber diet with a fiber supplement over the next few weeks after discharge.  Start with a pureed / full liquid diet (see below) ° °WALK °Walk an hour a day.  Control your pain to do that.   ° °CONTROL PAIN °Control pain so that you can walk, sleep, tolerate sneezing/coughing, go up/down stairs. ° °HAVE A BOWEL MOVEMENT DAILY °Keep your bowels regular to avoid problems.  OK to try a laxative to override constipation.  OK to use an antidairrheal to slow down diarrhea.  Call if not better after 2 tries ° °CALL IF YOU HAVE PROBLEMS/CONCERNS °Call if you are still struggling despite following these instructions. °Call if you have concerns not answered by these instructions ° °###################################################################### ° ° ° °1. DIET: Follow a light bland diet the first 24 hours after arrival home, such as soup, liquids, crackers, etc.  Be sure to include lots of fluids daily.  Avoid fast food or heavy meals as your are more likely to get nauseated.   °2. Take your usually prescribed home medications unless otherwise directed. °3. PAIN CONTROL: °a. Pain is best controlled by a usual combination of three different methods TOGETHER: °i. Ice/Heat °ii. Over the counter pain medication °iii. Prescription pain medication °b. Most patients will experience some swelling and bruising around the incisions.  Ice packs or heating pads (30-60 minutes up to 6 times a day) will help. Use ice for the first few days to help decrease swelling and bruising, then switch to heat to help relax tight/sore spots and speed recovery.  Some people prefer to use ice alone, heat alone, alternating between ice & heat.  Experiment to what works for you.  Swelling and bruising can take several weeks to resolve.   °c. It is helpful to take an over-the-counter pain medication  regularly for the first few weeks.  Choose one of the following that works best for you: °i. Naproxen (Aleve, etc)  Two 220mg tabs twice a day °ii. Ibuprofen (Advil, etc) Three 200mg tabs four times a day (every meal & bedtime) °iii. Acetaminophen (Tylenol, etc) 500-650mg four times a day (every meal & bedtime) °d. A  prescription for pain medication (such as oxycodone, hydrocodone, etc) should be given to you upon discharge.  Take your pain medication as prescribed.  °i. If you are having problems/concerns with the prescription medicine (does not control pain, nausea, vomiting, rash, itching, etc), please call us [phone] to see if we need to switch you to a different pain medicine that will work better for you and/or control your side effect better. °ii. If you need a refill on your pain medication, please contact your pharmacy.  They will contact our office to request authorization. Prescriptions will not be filled after 5 pm or on week-ends. °4. Avoid getting constipated.  Between the surgery and the pain medications, it is common to experience some constipation.  Increasing fluid intake and taking a fiber supplement (such as Metamucil, Citrucel, FiberCon, MiraLax, etc) 1-2 times a day regularly will usually help prevent this problem from occurring.  A mild laxative (prune juice, Milk of Magnesia, MiraLax, etc) should be taken according to package directions if there are no bowel movements after 48 hours.   °5. Wash / shower every day.  You may shower over the dressings as they are waterproof.  Continue to shower over incision(s) after the dressing is off. °6. Remove your waterproof bandages   2 days after surgery.  You may leave the incision open to air.  You may have skin tapes (Steri Strips) covering the incision(s).  Leave them on until one week, then remove.  You may replace a dressing/Band-Aid to cover the incision for comfort if you wish.      7. ACTIVITIES as tolerated:   a. You may resume  regular (light) daily activities beginning the next day--such as daily self-care, walking, climbing stairs--gradually increasing activities as tolerated.  If you can walk 30 minutes without difficulty, it is safe to try more intense activity such as jogging, treadmill, bicycling, low-impact aerobics, swimming, etc. b. Save the most intensive and strenuous activity for last such as sit-ups, heavy lifting, contact sports, etc  Refrain from any heavy lifting or straining until you are off narcotics for pain control.   c. DO NOT PUSH THROUGH PAIN.  Let pain be your guide: If it hurts to do something, don't do it.  Pain is your body warning you to avoid that activity for another week until the pain goes down. d. You may drive when you are no longer taking prescription pain medication, you can comfortably wear a seatbelt, and you can safely maneuver your car and apply brakes. e. Dennis Bast may have sexual intercourse when it is comfortable.  8. FOLLOW UP in our office a. Please call [facility] at [phone] to set up an appointment to see your surgeon in the office for a follow-up appointment approximately 2-3 weeks after your surgery. b. Make sure that you call for this appointment the day you arrive home to insure a convenient appointment time. 9. IF YOU HAVE DISABILITY OR FAMILY LEAVE FORMS, BRING THEM TO THE OFFICE FOR PROCESSING.  DO NOT GIVE THEM TO YOUR DOCTOR.   WHEN TO CALL us [phone]: 1. Poor pain control 2. Reactions / problems with new medications (rash/itching, nausea, etc)  3. Fever over 101.5 F (38.5 C) 4. Worsening swelling or bruising 5. Continued bleeding from incision. 6. Increased pain, redness, or drainage from the incision 7. Difficulty breathing / swallowing   The clinic staff is available to answer your questions during regular business hours (8:30am-5pm).  Please dont hesitate to call and ask to speak to one of our nurses for clinical concerns.   If you have a medical emergency,  go to the nearest emergency room or call 911.  A surgeon from [facility] is always on call at the [facility], [address], [place], [place], [place]  [address] ? MAIN: [phone] ? TOLL FREE: [phone] ?  FAX [phone] [email or web address]

## 2017-01-22 NOTE — H&P (Signed)
Shane Cruz Patient #: [id number] DOB: 02/23/1951 Married / Language: [name] / Race: White Male  History of Present Illness  Patient words: This is a very nice 66 year old gentleman with a long-standing history of multiple sebaceous cysts. He's got 4 on his back. One of them became inflamed and had to be lanced in 2015. Other than that he's had no procedures for them. They do cause some discomfort. No drainage.  The most inferior became inflamed and had to be lanced in our office last month.    Past Surgical History  Colon Polyp Removal - Colonoscopy Colon Polyp Removal - Open Open Inguinal Hernia Surgery Right. Oral Surgery  Diagnostic Studies History  Colonoscopy 1-5 years ago  Allergies  No Known Drug Allergies 08/06/2016  Medication History  TraZODone HCl (100MG  Tablet, Oral) Active. Simvastatin (40MG  Tablet, Oral) Active. Ramipril (10MG  Capsule, Oral) Active. Omeprazole (20MG  Capsule DR, Oral) Active. Atenolol (100MG  Tablet, Oral) Active. Aspirin (81MG  Tablet, Oral) Active. Fish Oil (1000MG  Capsule, Oral) Active. Multivitamin Adult (Oral) Active. Medications Reconciled  Social History  Alcohol use Remotely quit alcohol use. Caffeine use Coffee. No drug use Tobacco use Former smoker.  Family History Arthritis Father, Mother. Colon Cancer Father. Heart Disease Father. Hypertension Father.  Other Problems  Arthritis Gastroesophageal Reflux Disease High blood pressure Hypercholesterolemia Inguinal Hernia     Review of Systems  General Not Present- Appetite Loss, Chills, Fatigue, Fever, Night Sweats, Weight Gain and Weight Loss. Skin Not Present- Change in Wart/Mole, Dryness, Hives, Jaundice, New Lesions, Non-Healing Wounds, Rash and Ulcer. HEENT Present- Ringing in the Ears and Wears glasses/contact lenses. Not Present- Earache, Hearing Loss, Hoarseness, Nose Bleed, Oral Ulcers, Seasonal Allergies, Sinus Pain,  Sore Throat, Visual Disturbances and Yellow Eyes. Respiratory Not Present- Bloody sputum, Chronic Cough, Difficulty Breathing, Snoring and Wheezing. Breast Not Present- Breast Mass, Breast Pain, Nipple Discharge and Skin Changes. Cardiovascular Not Present- Chest Pain, Difficulty Breathing Lying Down, Leg Cramps, Palpitations, Rapid Heart Rate, Shortness of Breath and Swelling of Extremities. Gastrointestinal Not Present- Abdominal Pain, Bloating, Bloody Stool, Change in Bowel Habits, Chronic diarrhea, Constipation, Difficulty Swallowing, Excessive gas, Gets full quickly at meals, Hemorrhoids, Indigestion, Nausea, Rectal Pain and Vomiting. Male Genitourinary Not Present- Blood in Urine, Change in Urinary Stream, Frequency, Impotence, Nocturia, Painful Urination, Urgency and Urine Leakage. Musculoskeletal Not Present- Back Pain, Joint Pain, Joint Stiffness, Muscle Pain, Muscle Weakness and Swelling of Extremities. Neurological Not Present- Decreased Memory, Fainting, Headaches, Numbness, Seizures, Tingling, Tremor, Trouble walking and Weakness. Psychiatric Not Present- Anxiety, Bipolar, Change in Sleep Pattern, Depression, Fearful and Frequent crying. Endocrine Not Present- Cold Intolerance, Excessive Hunger, Hair Changes, Heat Intolerance, Hot flashes and New Diabetes. Hematology Not Present- Blood Thinners, Easy Bruising, Excessive bleeding, Gland problems, HIV and Persistent Infections.  Vitals   Weight: 212.5 lb Height: 72in Body Surface Area: 2.19 m Body Mass Index: 28.82 kg/m  Temp.: 98.18F  Pulse: 76 (Regular)  P.OX: 96% (Room air) BP: 110/68 (Sitting, Left Arm, Standard)    Physical Exam   General Note: Alert and oriented, well-appearing  Integumentary Note: There are 4 sebaceous cysts along the midline of his back, the largest (recently lanced) is about 4 cm in diameter and the smallest is about one cm diameter  Head and Neck Note: No mass or  thyromegaly  Eye Note: Anicteric, extraocular motion intact  Chest and Lung Exam Note: unlabored respirations, symmetrical air entry  Neurologic Note: grossly intact, normal gait  Neuropsychiatric Note: normal mood and affect, appropriate insight  Assessment & Plan  SEBACEOUS CYST (L72.3) Story: He actually has 4 of them starting at his upper back and going down the midline to his low thoracic region. The largest is about 4 cm in diameter. I recommend that these be excised in 1 trip to the operating room with sedation especially given the size, to minimize the chances of recurrence and maximize the efficiency of the procedures. We discussed risks of bleeding, infection, pain, scarring, recurrence, and that this will not prevent development of similar cysts elsewhere on his body. He expressed understanding. Will proceed with excision.

## 2017-01-22 NOTE — Anesthesia Preprocedure Evaluation (Addendum)
Anesthesia Evaluation  Patient identified by MRN, date of birth, ID band Patient awake    Reviewed: Allergy & Precautions, NPO status , Patient's Chart, lab work & pertinent test results  Airway Mallampati: II  TM Distance: >3 FB Neck ROM: Full    Dental  (+) Teeth Intact, Dental Advisory Given   Pulmonary neg pulmonary ROS, former smoker,    breath sounds clear to auscultation       Cardiovascular hypertension, Pt. on medications and Pt. on home beta blockers  Rhythm:Regular Rate:Normal     Neuro/Psych negative neurological ROS     GI/Hepatic Neg liver ROS, GERD  Medicated and Controlled,  Endo/Other  negative endocrine ROS  Renal/GU negative Renal ROS     Musculoskeletal   Abdominal   Peds  Hematology negative hematology ROS (+)   Anesthesia Other Findings   Reproductive/Obstetrics                            Anesthesia Physical Anesthesia Plan  ASA: II  Anesthesia Plan: General   Post-op Pain Management:    Induction: Intravenous  PONV Risk Score and Plan: Ondansetron and Dexamethasone  Airway Management Planned: Oral ETT  Additional Equipment:   Intra-op Plan:   Post-operative Plan: Extubation in OR  Informed Consent: I have reviewed the patients History and Physical, chart, labs and discussed the procedure including the risks, benefits and alternatives for the proposed anesthesia with the patient or authorized representative who has indicated his/her understanding and acceptance.   Dental advisory given  Plan Discussed with: CRNA and Anesthesiologist  Anesthesia Plan Comments:         Anesthesia Quick Evaluation

## 2017-01-22 NOTE — Transfer of Care (Signed)
Immediate Anesthesia Transfer of Care Note  Patient: Shane Cruz  Procedure(s) Performed: Procedure(s): EXCISION OF SEBACEOUS CYST X 4 FROM BACK (N/A)  Patient Location: PACU  Anesthesia Type:General  Level of Consciousness: oriented, sedated and patient cooperative  Airway & Oxygen Therapy: Patient Spontanous Breathing and Patient connected to nasal cannula oxygen  Post-op Assessment: Report given to RN and Post -op Vital signs reviewed and stable  Post vital signs: Reviewed  Last Vitals:  Vitals:   01/22/17 0824  BP: 129/83  Pulse: 65  Resp: 19  Temp: 36.6 C  SpO2: 99%    Last Pain:  Vitals:   01/22/17 0824  TempSrc: Oral         Complications: No apparent anesthesia complications

## 2017-01-22 NOTE — Op Note (Signed)
Operative Note  Shane Cruz  [id number]  [id number]  01/22/2017   Surgeon: Clovis Riley  Assistant: OR staff  Procedure performed: excision of sebaceous cysts x 4 from back, largest measured 2x3cm  Preop diagnosis: multiple sebaceous cysts  Post-op diagnosis/intraop findings: same  Specimens: sebaceous cysts Retained items: none EBL: minimal cc Complications: none  Description of procedure: After obtaining informed consent the patient was taken to the operating room and placed supine on operating room table wheregeneral endotracheal anesthesia was initiated, preoperative antibiotics were administered, SCDs applied, and a formal timeout was performed. He was flipped prone with all pressure points appropriately padded and the back was prepped and draped in the usual sterile fashion. Local was infiltrated in a field block around each of the 4 lesions. Beginning with the most cephalad cyst which was in the midline upper back, a transverse elliptical incision was made sharply and then cautery was used to dissect through the dermis and soft tissues, excising the cyst intact. In a similar fashion the two mid-back sebaceous cysts were excised followed by the lower back midline cyst. All were sent for routine pathological examination. Hemostasis was ensured within each wound and the wounds were irrigated with sterile saline. Each incision was closed with interrupted deep dermal 3-0 vicryls followed by interrupted vertical mattress sutures of 2-0 nylon. Dry dressings were then applied. The patient was then returned to the supine position, awakened, extubated and taken to PACU in stable condition.   All counts were correct at the completion of the case.

## 2017-01-23 ENCOUNTER — Encounter (HOSPITAL_COMMUNITY): Payer: Self-pay | Admitting: Surgery

## 2019-02-21 ENCOUNTER — Other Ambulatory Visit: Payer: Self-pay | Admitting: Urology

## 2019-02-21 DIAGNOSIS — R972 Elevated prostate specific antigen [PSA]: Secondary | ICD-10-CM

## 2019-03-16 ENCOUNTER — Ambulatory Visit
Admission: RE | Admit: 2019-03-16 | Discharge: 2019-03-16 | Disposition: A | Discharge status: Home / Self Care | Payer: BC Managed Care – PPO | Source: Ambulatory Visit | Attending: Urology | Admitting: Urology

## 2019-03-16 DIAGNOSIS — R972 Elevated prostate specific antigen [PSA]: Secondary | ICD-10-CM

## 2019-03-16 MED ORDER — GADOBENATE DIMEGLUMINE 529 MG/ML IV SOLN
20.0000 mL | Freq: Once | INTRAVENOUS | Status: AC | PRN
  Administered 2019-03-16: 13:00:00 20 mL via INTRAVENOUS

## 2019-07-11 DIAGNOSIS — I10 Essential (primary) hypertension: Secondary | ICD-10-CM | POA: Diagnosis not present

## 2019-07-11 DIAGNOSIS — F5101 Primary insomnia: Secondary | ICD-10-CM | POA: Diagnosis not present

## 2019-07-11 DIAGNOSIS — C61 Malignant neoplasm of prostate: Secondary | ICD-10-CM | POA: Diagnosis not present

## 2019-07-11 DIAGNOSIS — K219 Gastro-esophageal reflux disease without esophagitis: Secondary | ICD-10-CM | POA: Diagnosis not present

## 2019-07-11 DIAGNOSIS — E7800 Pure hypercholesterolemia, unspecified: Secondary | ICD-10-CM | POA: Diagnosis not present

## 2019-07-11 DIAGNOSIS — R7303 Prediabetes: Secondary | ICD-10-CM | POA: Diagnosis not present

## 2019-07-11 DIAGNOSIS — Z23 Encounter for immunization: Secondary | ICD-10-CM | POA: Diagnosis not present

## 2019-07-11 DIAGNOSIS — Z0000 Encounter for general adult medical examination without abnormal findings: Secondary | ICD-10-CM | POA: Diagnosis not present

## 2019-08-31 DIAGNOSIS — H524 Presbyopia: Secondary | ICD-10-CM | POA: Diagnosis not present

## 2019-08-31 DIAGNOSIS — H35033 Hypertensive retinopathy, bilateral: Secondary | ICD-10-CM | POA: Diagnosis not present

## 2019-08-31 DIAGNOSIS — E119 Type 2 diabetes mellitus without complications: Secondary | ICD-10-CM | POA: Diagnosis not present

## 2019-10-12 DIAGNOSIS — Z0100 Encounter for examination of eyes and vision without abnormal findings: Secondary | ICD-10-CM | POA: Diagnosis not present

## 2020-01-08 DIAGNOSIS — F5101 Primary insomnia: Secondary | ICD-10-CM | POA: Diagnosis not present

## 2020-01-08 DIAGNOSIS — R7309 Other abnormal glucose: Secondary | ICD-10-CM | POA: Diagnosis not present

## 2020-01-08 DIAGNOSIS — E7800 Pure hypercholesterolemia, unspecified: Secondary | ICD-10-CM | POA: Diagnosis not present

## 2020-01-08 DIAGNOSIS — M5412 Radiculopathy, cervical region: Secondary | ICD-10-CM | POA: Diagnosis not present

## 2020-01-08 DIAGNOSIS — R7303 Prediabetes: Secondary | ICD-10-CM | POA: Diagnosis not present

## 2020-01-08 DIAGNOSIS — I10 Essential (primary) hypertension: Secondary | ICD-10-CM | POA: Diagnosis not present

## 2020-01-08 DIAGNOSIS — K219 Gastro-esophageal reflux disease without esophagitis: Secondary | ICD-10-CM | POA: Diagnosis not present

## 2020-01-08 DIAGNOSIS — C61 Malignant neoplasm of prostate: Secondary | ICD-10-CM | POA: Diagnosis not present

## 2020-05-10 ENCOUNTER — Other Ambulatory Visit: Payer: Self-pay | Admitting: Family Medicine

## 2020-05-10 ENCOUNTER — Ambulatory Visit
Admission: RE | Admit: 2020-05-10 | Discharge: 2020-05-10 | Disposition: A | Discharge status: Home / Self Care | Payer: Medicare HMO | Source: Ambulatory Visit | Attending: Family Medicine | Admitting: Family Medicine

## 2020-05-10 DIAGNOSIS — M542 Cervicalgia: Secondary | ICD-10-CM

## 2020-07-12 DIAGNOSIS — F5101 Primary insomnia: Secondary | ICD-10-CM | POA: Diagnosis not present

## 2020-07-12 DIAGNOSIS — M542 Cervicalgia: Secondary | ICD-10-CM | POA: Diagnosis not present

## 2020-07-12 DIAGNOSIS — Z0000 Encounter for general adult medical examination without abnormal findings: Secondary | ICD-10-CM | POA: Diagnosis not present

## 2020-07-12 DIAGNOSIS — C61 Malignant neoplasm of prostate: Secondary | ICD-10-CM | POA: Diagnosis not present

## 2020-07-12 DIAGNOSIS — E7800 Pure hypercholesterolemia, unspecified: Secondary | ICD-10-CM | POA: Diagnosis not present

## 2020-07-12 DIAGNOSIS — K219 Gastro-esophageal reflux disease without esophagitis: Secondary | ICD-10-CM | POA: Diagnosis not present

## 2020-07-12 DIAGNOSIS — R7303 Prediabetes: Secondary | ICD-10-CM | POA: Diagnosis not present

## 2020-07-12 DIAGNOSIS — I10 Essential (primary) hypertension: Secondary | ICD-10-CM | POA: Diagnosis not present

## 2020-08-12 DIAGNOSIS — H5200 Hypermetropia, unspecified eye: Secondary | ICD-10-CM | POA: Diagnosis not present

## 2020-08-12 DIAGNOSIS — E7800 Pure hypercholesterolemia, unspecified: Secondary | ICD-10-CM | POA: Diagnosis not present

## 2020-08-12 DIAGNOSIS — E119 Type 2 diabetes mellitus without complications: Secondary | ICD-10-CM | POA: Diagnosis not present

## 2020-08-12 DIAGNOSIS — H25813 Combined forms of age-related cataract, bilateral: Secondary | ICD-10-CM | POA: Diagnosis not present

## 2020-08-12 DIAGNOSIS — I10 Essential (primary) hypertension: Secondary | ICD-10-CM | POA: Diagnosis not present

## 2020-08-12 DIAGNOSIS — Z0100 Encounter for examination of eyes and vision without abnormal findings: Secondary | ICD-10-CM | POA: Diagnosis not present

## 2020-08-19 DIAGNOSIS — C61 Malignant neoplasm of prostate: Secondary | ICD-10-CM | POA: Diagnosis not present

## 2020-09-24 DIAGNOSIS — R972 Elevated prostate specific antigen [PSA]: Secondary | ICD-10-CM | POA: Diagnosis not present

## 2020-10-01 DIAGNOSIS — R350 Frequency of micturition: Secondary | ICD-10-CM | POA: Diagnosis not present

## 2020-10-01 DIAGNOSIS — N3000 Acute cystitis without hematuria: Secondary | ICD-10-CM | POA: Diagnosis not present

## 2020-10-14 DIAGNOSIS — C61 Malignant neoplasm of prostate: Secondary | ICD-10-CM | POA: Diagnosis not present

## 2021-01-16 DIAGNOSIS — K219 Gastro-esophageal reflux disease without esophagitis: Secondary | ICD-10-CM | POA: Diagnosis not present

## 2021-01-16 DIAGNOSIS — I10 Essential (primary) hypertension: Secondary | ICD-10-CM | POA: Diagnosis not present

## 2021-01-16 DIAGNOSIS — E7800 Pure hypercholesterolemia, unspecified: Secondary | ICD-10-CM | POA: Diagnosis not present

## 2021-01-16 DIAGNOSIS — C61 Malignant neoplasm of prostate: Secondary | ICD-10-CM | POA: Diagnosis not present

## 2021-01-16 DIAGNOSIS — F5101 Primary insomnia: Secondary | ICD-10-CM | POA: Diagnosis not present

## 2021-01-16 DIAGNOSIS — M5412 Radiculopathy, cervical region: Secondary | ICD-10-CM | POA: Diagnosis not present

## 2021-01-16 DIAGNOSIS — R7309 Other abnormal glucose: Secondary | ICD-10-CM | POA: Diagnosis not present

## 2021-01-16 DIAGNOSIS — R7303 Prediabetes: Secondary | ICD-10-CM | POA: Diagnosis not present

## 2021-02-07 ENCOUNTER — Other Ambulatory Visit: Payer: Self-pay | Admitting: Family Medicine

## 2021-02-07 DIAGNOSIS — M5412 Radiculopathy, cervical region: Secondary | ICD-10-CM

## 2021-02-15 ENCOUNTER — Other Ambulatory Visit: Payer: Self-pay

## 2021-02-15 ENCOUNTER — Ambulatory Visit
Admission: RE | Admit: 2021-02-15 | Discharge: 2021-02-15 | Disposition: A | Discharge status: Home / Self Care | Payer: Medicare HMO | Source: Ambulatory Visit | Attending: Family Medicine | Admitting: Family Medicine

## 2021-02-15 DIAGNOSIS — M542 Cervicalgia: Secondary | ICD-10-CM | POA: Diagnosis not present

## 2021-02-15 DIAGNOSIS — M4802 Spinal stenosis, cervical region: Secondary | ICD-10-CM | POA: Diagnosis not present

## 2021-02-15 DIAGNOSIS — M5412 Radiculopathy, cervical region: Secondary | ICD-10-CM

## 2021-02-24 ENCOUNTER — Other Ambulatory Visit: Payer: Medicare HMO

## 2021-04-02 DIAGNOSIS — Z6826 Body mass index (BMI) 26.0-26.9, adult: Secondary | ICD-10-CM | POA: Diagnosis not present

## 2021-04-02 DIAGNOSIS — M47812 Spondylosis without myelopathy or radiculopathy, cervical region: Secondary | ICD-10-CM | POA: Diagnosis not present

## 2021-04-02 DIAGNOSIS — I10 Essential (primary) hypertension: Secondary | ICD-10-CM | POA: Diagnosis not present

## 2021-04-09 ENCOUNTER — Encounter: Payer: Self-pay | Admitting: Physical Therapy

## 2021-04-09 ENCOUNTER — Ambulatory Visit: Payer: Medicare HMO | Admitting: Physical Therapy

## 2021-04-09 ENCOUNTER — Other Ambulatory Visit: Payer: Self-pay

## 2021-04-09 DIAGNOSIS — M542 Cervicalgia: Secondary | ICD-10-CM

## 2021-04-09 DIAGNOSIS — R29898 Other symptoms and signs involving the musculoskeletal system: Secondary | ICD-10-CM | POA: Diagnosis not present

## 2021-04-09 DIAGNOSIS — R293 Abnormal posture: Secondary | ICD-10-CM | POA: Diagnosis not present

## 2021-04-09 NOTE — Therapy (Signed)
[place], [place], [address] Phone: [phone]   Fax:  [phone]  Physical Therapy Evaluation  Patient Details  Name: Shane Cruz MRN: [id number] Date of Birth: [birth year] Referring Provider (PT): Elsner   Encounter Date: 04/09/2021   PT End of Session - 04/09/21 1032     Visit Number 1    Number of Visits 12    Date for PT Re-Evaluation 05/21/21    Authorization Type [employer] medicare    Authorization - Visit Number 1    Progress Note Due on Visit 10    PT Start Time 0930    PT Stop Time 1015    PT Time Calculation (min) 45 min    Activity Tolerance Patient tolerated treatment well    Behavior During Therapy [facility] for tasks assessed/performed             Past Medical History:  Diagnosis Date   Arthritis    Colon polyps    Diverticulosis    GERD (gastroesophageal reflux disease)    History of hiatal hernia    Hypertension    Mitral valve insufficiency    mild   Myocarditis ([place])     Past Surgical History:  Procedure Laterality Date   gynomastia     HERNIA REPAIR     LIPOMA EXCISION N/A 01/22/2017   Procedure: EXCISION OF SEBACEOUS CYST X 4 FROM BACK;  Surgeon: Clovis Riley, MD;  Location: [place];  Service: General;  Laterality: N/A;    There were no vitals filed for this visit.    Subjective Assessment - 04/09/21 0939     Subjective Pt states he has had Rt sided neck pain that goes into upper traps for about a year. He states it has gotten worse in the past few months. Pain increases with looking down and when fatigued at the end of the day. Pain decreases with rest    Limitations House hold activities    Diagnostic tests MRI shows disc degeneration and spinal stenosis C6/7, foraminal stenosis C2/3    Patient Stated Goals decrease pain and stiffness    Currently in Pain? Yes    Pain Score 2     Pain Location Neck    Pain Orientation Right;Posterior    Pain  Descriptors / Indicators Sore    Pain Type Chronic pain    Pain Onset More than a month ago    Pain Frequency Intermittent    Aggravating Factors  cervical flexion    Pain Relieving Factors rest                [facility] PT Assessment - 04/09/21 0001       Assessment   Medical Diagnosis cervical spondylosis    Referring Provider (PT) Elsner    Onset Date/Surgical Date 04/01/20    Hand Dominance Right    Next MD Visit january 2023      Precautions   Precautions None      Restrictions   Weight Bearing Restrictions No      Balance Screen   Has the patient fallen in the past 6 months No      Prior Function   Level of Independence Independent      Observation/Other Assessments   Focus on Therapeutic Outcomes (FOTO)  67      Posture/Postural Control   Posture Comments rounded shoulders, forward head, neck in slight Lt sidebending when at rest      ROM /  Strength   AROM / PROM / Strength AROM;Strength      AROM   AROM Assessment Site Cervical    Cervical Flexion 41    Cervical Extension 35    Cervical - Right Side Bend 5    Cervical - Left Side Bend 11    Cervical - Right Rotation 32    Cervical - Left Rotation 43      Strength   Overall Strength Comments Rt shoulder flex 4+/5, shoulder abd 4/5, Lt shoulder flex and abd 4+/5      Palpation   Spinal mobility hypomobile CPAs and UPAs as well as downglides    Palpation comment TTP Rt cervical paraspinals and upper traps, increased mm spasticity                        Objective measurements completed on examination: See above findings.       [facility] Adult PT Treatment/Exercise - 04/09/21 0001       Exercises   Exercises Neck      Neck Exercises: Seated   Other Seated Exercise horizontal abduction red TB x 10    Other Seated Exercise bilat ER red TB x 10      Neck Exercises: Stretches   Upper Trapezius Stretch 30 seconds    Levator Stretch 30 seconds    Other Neck Stretches doorway stretch  at 90 x 30 sec      Manual Therapy   Manual therapy comments skilled palpation to assess effects of dry needling              Trigger Point Dry Needling - 04/09/21 0001     Consent Given? Yes    Education Handout Provided Yes    Muscles Treated Head and Neck Upper trapezius;Cervical multifidi    Upper Trapezius Response Palpable increased muscle length    Cervical multifidi Response Palpable increased muscle length                   PT Education - 04/09/21 1013     Education Details PT POC and goals, HEP, dry needling    Person(s) Educated Patient    Methods Explanation;Demonstration;Handout    Comprehension Returned demonstration;Verbalized understanding                 PT Long Term Goals - 04/09/21 1035       PT LONG TERM GOAL #1   Title Pt will be independent with HEP    Time 6    Period Weeks    Status New    Target Date 05/21/21      PT LONG TERM GOAL #2   Title Pt will improve FOTO to >= 69 to demo improved functional mobility    Time 6    Period Weeks    Status New    Target Date 05/21/21      PT LONG TERM GOAL #3   Title Pt will improve cervical sidebending ROM by 10 degrees bilat to reduce pain with gardening    Time 6    Period Weeks    Status New    Target Date 05/21/21      PT LONG TERM GOAL #4   Title Pt will improve cervical rotation by 10 degrees bilat to reduce difficulty with driving    Time 6    Period Weeks    Status New    Target Date 05/21/21  Plan - 04/09/21 1032     Clinical Impression Statement PT is a 70 y/o male referred for cervical spondylosis. Pt presents with pain, impaired posture, decreased ROM and strength, hypomobility, decreased functional activity tolerance and will benefit from skilled PT to address deficits and improve strength and mobility    Personal Factors and Comorbidities Age;Comorbidity 2;Fitness;Time since onset of injury/illness/exacerbation     Examination-Participation Restrictions Yard Work    Stability/Clinical Decision Making Stable/Uncomplicated    Clinical Decision Making Low    Rehab Potential Good    PT Frequency 2x / week    PT Duration 6 weeks    PT Treatment/Interventions Taping;Dry needling;Manual techniques;Patient/family education;Neuromuscular re-education;Therapeutic exercise;Therapeutic activities;Electrical Stimulation;Cryotherapy;Traction;Moist Heat;Aquatic Therapy    PT Next Visit Plan assess HEP and dry needling, postural strength, jt mobs for cervical ROM    PT Home Exercise Plan [id number]             Patient will benefit from skilled therapeutic intervention in order to improve the following deficits and impairments:  Pain, Postural dysfunction, Decreased activity tolerance, Decreased strength, Increased muscle spasms, Hypomobility, Decreased range of motion  Visit Diagnosis: Cervicalgia - Plan: PT plan of care cert/re-cert  Abnormal posture - Plan: PT plan of care cert/re-cert  Other symptoms and signs involving the musculoskeletal system - Plan: PT plan of care cert/re-cert     Problem List There are no problems to display for this patient.   Cassey Hurrell, PT 04/09/2021, 10:46 AM  [facility] [place], [place], [address] Phone: [phone]   Fax:  [phone]  Name: Shane Cruz MRN: [id number] Date of Birth: [birth year]

## 2021-04-09 NOTE — Patient Instructions (Signed)
Access Code: [email or web address] URL: [email or web address] Date: 04/09/2021 Prepared by: Isabelle Course  Exercises Doorway Pec Stretch at 90 Degrees Abduction - 1 x daily - 7 x weekly - 3 sets - 1 reps - 20-30 seconds hold Seated Cervical Sidebending Stretch - 1 x daily - 7 x weekly - 3 sets - 1 reps - 20-30 seconds hold Seated Levator Scapulae Stretch - 1 x daily - 7 x weekly - 3 sets - 1 reps - 20-30 seconds hold Standing Shoulder External Rotation with Resistance - 1 x daily - 7 x weekly - 3 sets - 10 reps Standing Shoulder Horizontal Abduction with Resistance - 1 x daily - 7 x weekly - 3 sets - 10 reps  Patient Education Trigger Point Dry Needling

## 2021-04-10 DIAGNOSIS — M47812 Spondylosis without myelopathy or radiculopathy, cervical region: Secondary | ICD-10-CM | POA: Diagnosis not present

## 2021-04-10 DIAGNOSIS — M5020 Other cervical disc displacement, unspecified cervical region: Secondary | ICD-10-CM | POA: Diagnosis not present

## 2021-04-10 DIAGNOSIS — Z6826 Body mass index (BMI) 26.0-26.9, adult: Secondary | ICD-10-CM | POA: Diagnosis not present

## 2021-04-10 DIAGNOSIS — I10 Essential (primary) hypertension: Secondary | ICD-10-CM | POA: Diagnosis not present

## 2021-04-10 DIAGNOSIS — M5412 Radiculopathy, cervical region: Secondary | ICD-10-CM | POA: Diagnosis not present

## 2021-04-11 ENCOUNTER — Encounter: Payer: Self-pay | Admitting: Physical Therapy

## 2021-04-11 ENCOUNTER — Other Ambulatory Visit: Payer: Self-pay

## 2021-04-11 ENCOUNTER — Encounter: Payer: Medicare HMO | Admitting: Physical Therapy

## 2021-04-11 ENCOUNTER — Ambulatory Visit: Payer: Medicare HMO | Admitting: Physical Therapy

## 2021-04-11 DIAGNOSIS — R29898 Other symptoms and signs involving the musculoskeletal system: Secondary | ICD-10-CM | POA: Diagnosis not present

## 2021-04-11 DIAGNOSIS — R293 Abnormal posture: Secondary | ICD-10-CM

## 2021-04-11 DIAGNOSIS — M542 Cervicalgia: Secondary | ICD-10-CM

## 2021-04-11 NOTE — Patient Instructions (Addendum)
TENS UNIT  This is helpful for muscle pain and spasm.   Search and Purchase a TENS 7000 2nd edition at  [employer]  (It should be less than $30)     TENS unit instructions:  Do not shower or bathe with the unit on Turn the unit off before removing electrodes or batteries If the electrodes lose stickiness add a drop of water to the electrodes after they are disconnected from the unit and place on plastic sheet. If you continued to have difficulty, call the TENS unit company to purchase more electrodes. Do not apply lotion on the skin area prior to use. Make sure the skin is clean and dry as this will help prolong the life of the electrodes. After use, always check skin for unusual red areas, rash or other skin difficulties. If there are any skin problems, does not apply electrodes to the same area. Never remove the electrodes from the unit by pulling the wires. Do not use the TENS unit or electrodes other than as directed. Do not change electrode placement without consulting your therapist or physician. Keep 2 fingers width between each electrode. Access Code: [email or web address] URL: [email or web address] Date: 04/11/2021 Prepared by: [facility] [place]  Exercises Doorway Pec Stretch at 90 Degrees Abduction - 1 x daily - 7 x weekly - 3 sets - 1 reps - 20-30 seconds hold Seated Cervical Sidebending Stretch - 1 x daily - 7 x weekly - 3 sets - 1 reps - 20-30 seconds hold Seated Levator Scapulae Stretch - 1 x daily - 7 x weekly - 3 sets - 1 reps - 20-30 seconds hold Standing Shoulder External Rotation with Resistance - 1 x daily - 7 x weekly - 2 sets - 10 reps Standing Shoulder Horizontal Abduction with Resistance - 1 x daily - 7 x weekly - 2 sets - 10 reps Standing Row with Anchored Resistance - 1 x daily - 7 x weekly - 2 sets - 10 reps

## 2021-04-11 NOTE — Therapy (Addendum)
[place], [place], [address] Phone: [phone]   Fax:  [phone]  Physical Therapy Treatment and Discharge  Patient Details  Name: Shane Cruz MRN: [id number] Date of Birth: [birth year] Referring Provider (PT): Elsner   Encounter Date: 04/11/2021   PT End of Session - 04/11/21 1018     Visit Number 2    Number of Visits 12    Date for PT Re-Evaluation 05/21/21    Authorization Type [employer] medicare    Authorization - Visit Number 2    Progress Note Due on Visit 10    PT Start Time 9323    PT Stop Time 1053    PT Time Calculation (min) 34 min    Activity Tolerance Patient tolerated treatment well    Behavior During Therapy WFL for tasks assessed/performed             Past Medical History:  Diagnosis Date   Arthritis    Colon polyps    Diverticulosis    GERD (gastroesophageal reflux disease)    History of hiatal hernia    Hypertension    Mitral valve insufficiency    mild   Myocarditis ([place])     Past Surgical History:  Procedure Laterality Date   gynomastia     HERNIA REPAIR     LIPOMA EXCISION N/A 01/22/2017   Procedure: EXCISION OF SEBACEOUS CYST X 4 FROM BACK;  Surgeon: Clovis Riley, MD;  Location: [facility];  Service: General;  Laterality: N/A;    There were no vitals filed for this visit.   Subjective Assessment - 04/11/21 1019     Subjective Pt reports no difference with DN.  He has completed HEP exercises.  Plans to get injection on Wednesday. He will be going on trip for a few weeks after that.  He will return to PT in December if he doesn't get relief with injection.    Currently in Pain? Yes    Pain Score 1     Pain Location Neck    Pain Orientation Right;Posterior    Pain Descriptors / Indicators Dull    Aggravating Factors  cervical extension    Pain Relieving Factors rest                [facility] PT Assessment - 04/11/21 0001       Assessment    Medical Diagnosis cervical spondylosis    Referring Provider (PT) Elsner    Onset Date/Surgical Date 04/01/20    Hand Dominance Right    Next MD Visit january 2023                [facility] PT Treatment/Exercise - 04/11/21 0001       Self-Care   Self-Care Other Self-Care Comments    Other Self-Care Comments  pt instructed on self application of TENS unit, including parameters and rationale.  Pt returned demo of unit operation and verbalized understanding - trial of 6 min with MHP.      Neck Exercises: Standing   Other Standing Exercises bilat horiz abdct with red band x 10,  bilat shoulder ER with red band x 10.  Row with green band x 10      Neck Exercises: Stretches   Upper Trapezius Stretch Right;Left;2 reps;20 seconds    Levator Stretch Right;Left;2 reps;20 seconds    Other Neck Stretches 3 position doorway stretch, x 15 sec each x 2 reps  PT Education - 04/11/21 1218     Education Details HEP updated, TENS info    Person(s) Educated Patient    Methods Explanation;Demonstration;Tactile cues    Comprehension Verbalized understanding;Returned demonstration                 PT Long Term Goals - 04/09/21 1035       PT LONG TERM GOAL #1   Title Pt will be independent with HEP    Time 6    Period Weeks    Status New    Target Date 05/21/21      PT LONG TERM GOAL #2   Title Pt will improve FOTO to >= 69 to demo improved functional mobility    Time 6    Period Weeks    Status New    Target Date 05/21/21      PT LONG TERM GOAL #3   Title Pt will improve cervical sidebending ROM by 10 degrees bilat to reduce pain with gardening    Time 6    Period Weeks    Status New    Target Date 05/21/21      PT LONG TERM GOAL #4   Title Pt will improve cervical rotation by 10 degrees bilat to reduce difficulty with driving    Time 6    Period Weeks    Status New    Target Date 05/21/21                   Plan - 04/11/21 1102      Clinical Impression Statement Reviewed form of exercises of HEP.  Pt tolerated exercises well, without increase in symptoms.  Minor cues for form.  Pt shown TENS unit as tool for pain management in future. Goals are ongoing.    Personal Factors and Comorbidities Age;Comorbidity 2;Fitness;Time since onset of injury/illness/exacerbation    Examination-Participation Restrictions Yard Work    Stability/Clinical Decision Making Stable/Uncomplicated    Rehab Potential Good    PT Frequency 2x / week    PT Duration 6 weeks    PT Treatment/Interventions Taping;Dry needling;Manual techniques;Patient/family education;Neuromuscular re-education;Therapeutic exercise;Therapeutic activities;Electrical Stimulation;Cryotherapy;Traction;Moist Heat;Aquatic Therapy    PT Next Visit Plan assess HEP and dry needling, postural strength, jt mobs for cervical ROM    PT Home Exercise Plan [email or web address]             Patient will benefit from skilled therapeutic intervention in order to improve the following deficits and impairments:  Pain, Postural dysfunction, Decreased activity tolerance, Decreased strength, Increased muscle spasms, Hypomobility, Decreased range of motion  Visit Diagnosis: Cervicalgia  Abnormal posture  Other symptoms and signs involving the musculoskeletal system     Problem List There are no problems to display for this patient.  PHYSICAL THERAPY DISCHARGE SUMMARY  Visits from Start of Care: 2  Current functional level related to goals / functional outcomes: Improved strength   Remaining deficits: See above   Education / Equipment: HEP   Patient agrees to discharge. Patient goals were not met. Patient is being discharged due to not returning since the last visit. Isabelle Course, PT,DPT12/11/2209:37 AM   Kerin Perna, PTA 04/11/21 12:22 PM   [facility] [place], [place], [address] Phone:  [phone]   Fax:  [phone]  Name: Shane Cruz MRN: [id number] Date of Birth: 1951-05-27

## 2021-04-16 ENCOUNTER — Encounter: Payer: Medicare HMO | Admitting: Physical Therapy

## 2021-04-16 DIAGNOSIS — M5412 Radiculopathy, cervical region: Secondary | ICD-10-CM | POA: Diagnosis not present

## 2021-04-18 ENCOUNTER — Encounter: Payer: Medicare HMO | Admitting: Physical Therapy

## 2021-06-04 DIAGNOSIS — I10 Essential (primary) hypertension: Secondary | ICD-10-CM | POA: Diagnosis not present

## 2021-06-04 DIAGNOSIS — Z6826 Body mass index (BMI) 26.0-26.9, adult: Secondary | ICD-10-CM | POA: Diagnosis not present

## 2021-06-04 DIAGNOSIS — M5412 Radiculopathy, cervical region: Secondary | ICD-10-CM | POA: Diagnosis not present

## 2021-06-17 DIAGNOSIS — C61 Malignant neoplasm of prostate: Secondary | ICD-10-CM | POA: Diagnosis not present

## 2021-06-24 DIAGNOSIS — C61 Malignant neoplasm of prostate: Secondary | ICD-10-CM | POA: Diagnosis not present

## 2021-06-24 DIAGNOSIS — N401 Enlarged prostate with lower urinary tract symptoms: Secondary | ICD-10-CM | POA: Diagnosis not present

## 2021-06-24 DIAGNOSIS — R350 Frequency of micturition: Secondary | ICD-10-CM | POA: Diagnosis not present

## 2021-06-24 DIAGNOSIS — R3912 Poor urinary stream: Secondary | ICD-10-CM | POA: Diagnosis not present

## 2021-07-03 ENCOUNTER — Other Ambulatory Visit: Payer: Self-pay | Admitting: Urology

## 2021-07-15 DIAGNOSIS — E7800 Pure hypercholesterolemia, unspecified: Secondary | ICD-10-CM | POA: Diagnosis not present

## 2021-07-15 DIAGNOSIS — K219 Gastro-esophageal reflux disease without esophagitis: Secondary | ICD-10-CM | POA: Diagnosis not present

## 2021-07-15 DIAGNOSIS — R7303 Prediabetes: Secondary | ICD-10-CM | POA: Diagnosis not present

## 2021-07-15 DIAGNOSIS — F5101 Primary insomnia: Secondary | ICD-10-CM | POA: Diagnosis not present

## 2021-07-15 DIAGNOSIS — C61 Malignant neoplasm of prostate: Secondary | ICD-10-CM | POA: Diagnosis not present

## 2021-07-15 DIAGNOSIS — Z0000 Encounter for general adult medical examination without abnormal findings: Secondary | ICD-10-CM | POA: Diagnosis not present

## 2021-07-15 DIAGNOSIS — I10 Essential (primary) hypertension: Secondary | ICD-10-CM | POA: Diagnosis not present

## 2021-07-15 DIAGNOSIS — N529 Male erectile dysfunction, unspecified: Secondary | ICD-10-CM | POA: Diagnosis not present

## 2021-07-18 ENCOUNTER — Encounter (HOSPITAL_COMMUNITY): Payer: Self-pay

## 2021-07-18 ENCOUNTER — Other Ambulatory Visit: Payer: Self-pay

## 2021-07-18 DIAGNOSIS — I2510 Atherosclerotic heart disease of native coronary artery without angina pectoris: Secondary | ICD-10-CM | POA: Diagnosis not present

## 2021-07-18 DIAGNOSIS — K219 Gastro-esophageal reflux disease without esophagitis: Secondary | ICD-10-CM | POA: Diagnosis not present

## 2021-07-18 DIAGNOSIS — Z01818 Encounter for other preprocedural examination: Secondary | ICD-10-CM | POA: Diagnosis not present

## 2021-07-18 DIAGNOSIS — Z87891 Personal history of nicotine dependence: Secondary | ICD-10-CM | POA: Diagnosis not present

## 2021-07-18 DIAGNOSIS — I10 Essential (primary) hypertension: Secondary | ICD-10-CM | POA: Diagnosis not present

## 2021-07-18 DIAGNOSIS — N320 Bladder-neck obstruction: Secondary | ICD-10-CM | POA: Diagnosis not present

## 2021-07-18 DIAGNOSIS — R7303 Prediabetes: Secondary | ICD-10-CM | POA: Diagnosis not present

## 2021-07-18 DIAGNOSIS — Z8679 Personal history of other diseases of the circulatory system: Secondary | ICD-10-CM | POA: Diagnosis not present

## 2021-07-18 NOTE — Patient Instructions (Addendum)
DUE TO COVID-19 ONLY ONE VISITOR IS ALLOWED TO COME WITH YOU AND STAY IN THE WAITING ROOM ONLY DURING PRE OP AND PROCEDURE.   **NO VISITORS ARE ALLOWED IN THE SHORT STAY AREA OR RECOVERY ROOM!!**  IF YOU WILL BE ADMITTED INTO THE HOSPITAL YOU ARE ALLOWED ONLY TWO SUPPORT PEOPLE DURING VISITATION HOURS ONLY (7 AM -8PM)   The support person(s) must pass our screening, gel in and out, and wear a mask at all times, including in the patients room. Patients must also wear a mask when staff or their support person are in the room. Visitors GUEST BADGE MUST BE WORN VISIBLY  One adult visitor may remain with you overnight and MUST be in the room by 8 P.M.  No visitors under the age of 59. Any visitor under the age of 31 must be accompanied by an adult.    COVID SWAB TESTING MUST BE COMPLETED ON:  07/30/21   Site: [facility] [place]  Enter: Main Entrance have a seat in the waiting area to the right of main entrance (DO NOT Outlook!!!!!) Dial: [phone] to alert staff you have arrived  You are not required to quarantine, however you are required to wear a well-fitted mask when you are out and around people not in your household.  Hand Hygiene often Do NOT share personal items Notify your provider if you are in close contact with someone who has COVID or you develop fever 100.4 or greater, new onset of sneezing, cough, sore throat, shortness of breath or body aches.  [place], [address], must go inside of the hospital, NOT A DRIVE THRU!  (Must self quarantine after testing. Follow instructions on handout.)       Your procedure is scheduled on: 08/01/21   Report to [facility] Main Entrance    Report to admitting at : 9:15 AM   Call this number if you have problems the morning of surgery [phone]    May have liquids until : 8:30 AM   day of surgery  CLEAR LIQUID  DIET: STARTING DAY BEFORE SURGERY UNTIL: 8:30 AM THE DAY OF SURGERY.  Foods Allowed                                                                     Foods Excluded  Water, Black Coffee and tea, regular and decaf                             liquids that you cannot  Plain Jell-O in any flavor  (No red)                                           see through such as: Fruit ices (not with fruit pulp)                                     milk, soups, orange juice  Iced Popsicles (No red)                                    All solid food                                   Apple juices Sports drinks like Gatorade (No red) Lightly seasoned clear broth or consume(fat free) Sugar Sample Menu Breakfast                                Lunch                                     Supper Cranberry juice                    Beef broth                            Chicken broth Jell-O                                     Grape juice                           Apple juice Coffee or tea                        Jell-O                                      Popsicle                                                Coffee or tea                        Coffee or tea     FOLLOW BOWEL PREP AND ANY ADDITIONAL PRE OP INSTRUCTIONS YOU RECEIVED FROM YOUR SURGEON'S OFFICE!!!   Oral Hygiene is also important to reduce your risk of infection.                                    Remember - BRUSH YOUR TEETH THE MORNING OF SURGERY WITH YOUR REGULAR TOOTHPASTE   Do NOT smoke after Midnight   Take these medicines the morning of surgery with A SIP OF WATER: atenolol,omeprazole.Tylenol as needed. How to Manage Your Diabetes Before and After Surgery  Why is it important to control my blood sugar before and after surgery? Improving blood sugar levels before and after surgery helps healing and can limit problems. A way of improving blood sugar control is eating a healthy diet by:  Eating less sugar and carbohydrates   Increasing activity/exercise  Talking with your doctor about reaching your blood sugar goals High  blood sugars (greater than 180 mg/dL) can raise your risk of infections and slow your recovery, so you will need to focus on controlling your diabetes during the weeks before surgery. Make sure that the doctor who takes care of your diabetes knows about your planned surgery including the date and location.  How do I manage my blood sugar before surgery? Check your blood sugar at least 4 times a day, starting 2 days before surgery, to make sure that the level is not too high or low. Check your blood sugar the morning of your surgery when you wake up and every 2 hours until you get to the Short Stay unit. If your blood sugar is less than 70 mg/dL, you will need to treat for low blood sugar: Do not take insulin. Treat a low blood sugar (less than 70 mg/dL) with  cup of clear juice (cranberry or apple), 4 glucose tablets, OR glucose gel. Recheck blood sugar in 15 minutes after treatment (to make sure it is greater than 70 mg/dL). If your blood sugar is not greater than 70 mg/dL on recheck, call [phone] for further instructions. Report your blood sugar to the short stay nurse when you get to Short Stay.  If you are admitted to the hospital after surgery: Your blood sugar will be checked by the staff and you will probably be given insulin after surgery (instead of oral diabetes medicines) to make sure you have good blood sugar levels. The goal for blood sugar control after surgery is 80-180 mg/dL.   WHAT DO I DO ABOUT MY DIABETES MEDICATION?  Do not take oral diabetes medicines (pills) the morning of surgery.  THE DAY BEFORE SURGERY, take metformin as usual.      THE MORNING OF SURGERY,DO NOT TAKE ANY ORAL DIABETIC MEDICATIONS DAY OF YOUR SURGERY                              You may not have any metal on your body including hair pins, jewelry, and body piercing              Men may shave  face and neck.   Do not bring valuables to the hospital. [place].   Contacts, dentures or bridgework may not be worn into surgery.   Bring small overnight bag day of surgery.    Patients discharged on the day of surgery will not be allowed to drive home.  Someone needs to stay with you for the first 24 hours after anesthesia.   Special Instructions: Bring a copy of your healthcare power of attorney and living will documents         the day of surgery if you haven't scanned them before.              Please read over the following fact sheets you were given: IF YOU HAVE QUESTIONS ABOUT YOUR PRE-OP INSTRUCTIONS PLEASE CALL [phone]     [facility] - Preparing for Surgery Before surgery, you can play an important role.  Because skin is not sterile, your skin needs to be as free of germs as possible.  You can reduce the number of germs on your skin by washing with CHG (chlorahexidine gluconate) soap before surgery.  CHG is an antiseptic cleaner which kills germs and bonds with the skin to continue killing  germs even after washing. Please DO NOT use if you have an allergy to CHG or antibacterial soaps.  If your skin becomes reddened/irritated stop using the CHG and inform your nurse when you arrive at Short Stay. Do not shave (including legs and underarms) for at least 48 hours prior to the first CHG shower.  You may shave your face/neck. Please follow these instructions carefully:  1.  Shower with CHG Soap the night before surgery and the  morning of Surgery.  2.  If you choose to wash your hair, wash your hair first as usual with your  normal  shampoo.  3.  After you shampoo, rinse your hair and body thoroughly to remove the  shampoo.                           4.  Use CHG as you would any other liquid soap.  You can apply chg directly  to the skin and wash                       Gently with a scrungie or clean washcloth.  5.  Apply the CHG  Soap to your body ONLY FROM THE NECK DOWN.   Do not use on face/ open                           Wound or open sores. Avoid contact with eyes, ears mouth and genitals (private parts).                       Wash face,  Genitals (private parts) with your normal soap.             6.  Wash thoroughly, paying special attention to the area where your surgery  will be performed.  7.  Thoroughly rinse your body with warm water from the neck down.  8.  DO NOT shower/wash with your normal soap after using and rinsing off  the CHG Soap.                9.  Pat yourself dry with a clean towel.            10.  Wear clean pajamas.            11.  Place clean sheets on your bed the night of your first shower and do not  sleep with pets. Day of Surgery : Do not apply any lotions/deodorants the morning of surgery.  Please wear clean clothes to the hospital/surgery center.  FAILURE TO FOLLOW THESE INSTRUCTIONS MAY RESULT IN THE CANCELLATION OF YOUR SURGERY PATIENT SIGNATURE_________________________________  NURSE SIGNATURE__________________________________  ________________________________________________________________________

## 2021-07-21 ENCOUNTER — Encounter (HOSPITAL_COMMUNITY): Payer: Self-pay

## 2021-07-21 ENCOUNTER — Other Ambulatory Visit: Payer: Self-pay

## 2021-07-21 ENCOUNTER — Encounter (HOSPITAL_COMMUNITY)
Admission: RE | Admit: 2021-07-21 | Discharge: 2021-07-21 | Disposition: A | Discharge status: Home / Self Care | Payer: Medicare HMO | Source: Ambulatory Visit | Attending: Urology | Admitting: Urology

## 2021-07-21 VITALS — BP 126/89 | HR 80 | Temp 98.0°F | Ht 72.0 in | Wt 194.0 lb

## 2021-07-21 DIAGNOSIS — Z01818 Encounter for other preprocedural examination: Secondary | ICD-10-CM | POA: Insufficient documentation

## 2021-07-21 DIAGNOSIS — I2510 Atherosclerotic heart disease of native coronary artery without angina pectoris: Secondary | ICD-10-CM | POA: Insufficient documentation

## 2021-07-21 DIAGNOSIS — R7303 Prediabetes: Secondary | ICD-10-CM | POA: Diagnosis not present

## 2021-07-21 DIAGNOSIS — I10 Essential (primary) hypertension: Secondary | ICD-10-CM | POA: Insufficient documentation

## 2021-07-21 DIAGNOSIS — K219 Gastro-esophageal reflux disease without esophagitis: Secondary | ICD-10-CM | POA: Diagnosis not present

## 2021-07-21 DIAGNOSIS — Z8679 Personal history of other diseases of the circulatory system: Secondary | ICD-10-CM | POA: Insufficient documentation

## 2021-07-21 DIAGNOSIS — N320 Bladder-neck obstruction: Secondary | ICD-10-CM | POA: Diagnosis not present

## 2021-07-21 DIAGNOSIS — Z87891 Personal history of nicotine dependence: Secondary | ICD-10-CM | POA: Insufficient documentation

## 2021-07-21 HISTORY — DX: Malignant (primary) neoplasm, unspecified: C80.1

## 2021-07-21 HISTORY — DX: Acute myocardial infarction, unspecified: I21.9

## 2021-07-21 HISTORY — DX: Prediabetes: R73.03

## 2021-07-21 LAB — CBC
HCT: 46.3 % (ref 39.0–52.0)
Hemoglobin: 15.1 g/dL (ref 13.0–17.0)
MCH: 27.3 pg (ref 26.0–34.0)
MCHC: 32.6 g/dL (ref 30.0–36.0)
MCV: 83.7 fL (ref 80.0–100.0)
Platelets: 213 10*3/uL (ref 150–400)
RBC: 5.53 MIL/uL (ref 4.22–5.81)
RDW: 14.8 % (ref 11.5–15.5)
WBC: 6.5 10*3/uL (ref 4.0–10.5)
nRBC: 0 % (ref 0.0–0.2)

## 2021-07-21 LAB — BASIC METABOLIC PANEL
Anion gap: 7 (ref 5–15)
BUN: 23 mg/dL (ref 8–23)
CO2: 26 mmol/L (ref 22–32)
Calcium: 9 mg/dL (ref 8.9–10.3)
Chloride: 104 mmol/L (ref 98–111)
Creatinine, Ser: 1.2 mg/dL (ref 0.61–1.24)
GFR, Estimated: >60 mL/min (ref >60)
Glucose, Bld: 105 mg/dL — ABNORMAL HIGH (ref 70–99)
Potassium: 4.4 mmol/L (ref 3.5–5.1)
Sodium: 137 mmol/L (ref 135–145)

## 2021-07-21 LAB — HEMOGLOBIN A1C
Hgb A1c MFr Bld: 5.9 % — ABNORMAL HIGH (ref 4.8–5.6)
Mean Plasma Glucose: 123 mg/dL

## 2021-07-21 LAB — GLUCOSE, CAPILLARY: Glucose-Capillary: 103 mg/dL — ABNORMAL HIGH (ref 70–99)

## 2021-07-21 NOTE — Progress Notes (Addendum)
COVID Vaccine Completed: Yes Date COVID Vaccine completed: 01/12/20 x 2 COVID vaccine manufacturer: Pfizer      COVID Test: 07/30/21 @ 12:00 PM Bowel prep reminder: Pt. Does not remember about bowel prep.Pt. was advised to review surgeon's instructions,if he has a prep instructions he will be on clear liquid diet the day of prep.Pt. verbalized his understanding about that.  PCP - Dr. Linna Darner Cardiologist - N/A. Last time 2016  Chest x-ray -  EKG -  Stress Test -  ECHO - 05/20/15. [facility] Cardiac Cath - 2021 Pacemaker/ICD device last checked:  Sleep Study -  CPAP -   Fasting Blood Sugar -  Checks Blood Sugar _____ times a day  Blood Thinner Instructions: Aspirin Instructions: was stoped 2 months ago. Last Dose:  Anesthesia review: Hx: HTN,MI  Patient denies shortness of breath, fever, cough and chest pain at PAT appointment   Patient verbalized understanding of instructions that were given to them at the PAT appointment. Patient was also instructed that they will need to review over the PAT instructions again at home before surgery.

## 2021-07-22 ENCOUNTER — Encounter (HOSPITAL_COMMUNITY): Payer: Self-pay

## 2021-07-22 NOTE — Progress Notes (Addendum)
Anesthesia Chart Review   Case: [id number] Date/Time: 08/01/21 1115   Procedure: TRANSURETHRAL RESECTION OF THE PROSTATE (TURP)   Anesthesia type: General   Pre-op diagnosis: BLADDER OUTLET OBSTRUCTION   Location: [place] [facility]   Surgeons: Ardis Hughs, MD       DISCUSSION:71 y.o. former smoker with h/o HTN, GERD, bladder outlet obstruction scheduled for above procedure 08/01/21 with Dr. Louis Meckel.   Per cardiology note 04/30/2015 pt with history of viral myocarditis in 2001, most likely erroneously told he had a MI. Cardiac cath at this time normal.   Echo 05/20/2015 with EF 55-60%.   Anticipate pt can proceed with planned procedure barring acute status change.   VS: BP 126/89    Pulse 80    Temp 36.7 C (Oral)    Ht 6' (1.829 m)    Wt 88 kg    SpO2 99%    BMI 26.31 kg/m   PROVIDERS: Waldemar Dickens, MD is PCP    LABS: Labs reviewed: Acceptable for surgery. (all labs ordered are listed, but only abnormal results are displayed)  Labs Reviewed  BASIC METABOLIC PANEL - Abnormal; Notable for the following components:      Result Value   Glucose, Bld 105 (*)    All other components within normal limits  HEMOGLOBIN A1C - Abnormal; Notable for the following components:   Hgb A1c MFr Bld 5.9 (*)    All other components within normal limits  GLUCOSE, CAPILLARY - Abnormal; Notable for the following components:   Glucose-Capillary 103 (*)    All other components within normal limits  CBC     IMAGES:   EKG: 07/21/2021 Rate 74 bpm  Sinus rhythm with 1st degree A-V block RSR' or QR pattern in V1 suggests right ventricular conduction delay Minimal voltage criteria for LVH, may be normal variant ( R in aVL ) Borderline ECG  CV: Echo 05/20/2015 The study was technically adequate. A complete two-dimensional transthoracic echocardiogram was performed. The left ventricle is normal in size.  There is mild concentric left ventricular hypertrophy with normal  wall motion and ejection fraction 55-60%.  Grade I mild diastolic dysfunction; abnormal relaxation pattern.  The left atrium is moderately dilated.  Right ventricular systolic pressure is normal.  Past Medical History:  Diagnosis Date   Arthritis    Cancer ([place])    Colon polyps    Diverticulosis    GERD (gastroesophageal reflux disease)    History of hiatal hernia    Hypertension    Mitral valve insufficiency    mild   Myocardial infarction ([place])    Myocarditis ([place])    Pre-diabetes     Past Surgical History:  Procedure Laterality Date   ARTHROSCOPY KNEE W/ DRILLING Left    CARDIAC CATHETERIZATION     gynomastia     HERNIA REPAIR     LIPOMA EXCISION N/A 01/22/2017   Procedure: EXCISION OF SEBACEOUS CYST X 4 FROM BACK;  Surgeon: Clovis Riley, MD;  Location: [facility];  Service: General;  Laterality: N/A;   TOTAL KNEE ARTHROPLASTY Left     MEDICATIONS:  acetaminophen (TYLENOL) 650 MG CR tablet   atenolol (TENORMIN) 100 MG tablet   CALCIUM CARB-MAGNESIUM CARB PO   Cholecalciferol (VITAMIN D3) 50 MCG (2000 UT) TABS   diclofenac Sodium (VOLTAREN) 1 % GEL   fish oil-omega-3 fatty acids 1000 MG capsule   hydrochlorothiazide (HYDRODIURIL) 25 MG tablet   metFORMIN (GLUCOPHAGE) 1000 MG tablet   Multiple  Vitamin (MULTIVITAMIN WITH MINERALS) TABS tablet   nitroGLYCERIN (NITROSTAT) 0.4 MG SL tablet   omeprazole (PRILOSEC) 20 MG capsule   ramipril (ALTACE) 10 MG capsule   simvastatin (ZOCOR) 40 MG tablet   tamsulosin (FLOMAX) 0.4 MG CAPS capsule   traZODone (DESYREL) 100 MG tablet   TURMERIC CURCUMIN PO   ZINC-VITAMIN C PO   No current facility-administered medications for this encounter.    Shane Felix Ward, PA-C [facility] Pre-Surgical Testing [phone]

## 2021-07-30 ENCOUNTER — Other Ambulatory Visit: Payer: Self-pay

## 2021-07-30 ENCOUNTER — Encounter (HOSPITAL_COMMUNITY)
Admission: RE | Admit: 2021-07-30 | Discharge: 2021-07-30 | Disposition: A | Discharge status: Home / Self Care | Payer: Medicare HMO | Source: Ambulatory Visit | Attending: Urology | Admitting: Urology

## 2021-07-30 DIAGNOSIS — Z01812 Encounter for preprocedural laboratory examination: Secondary | ICD-10-CM | POA: Diagnosis not present

## 2021-07-30 DIAGNOSIS — Z01818 Encounter for other preprocedural examination: Secondary | ICD-10-CM

## 2021-07-30 DIAGNOSIS — Z20822 Contact with and (suspected) exposure to covid-19: Secondary | ICD-10-CM | POA: Diagnosis not present

## 2021-07-30 LAB — SARS CORONAVIRUS 2 (TAT 6-24 HRS): SARS Coronavirus 2: NEGATIVE

## 2021-08-01 ENCOUNTER — Encounter (HOSPITAL_COMMUNITY)
Admission: RE | Disposition: A | Discharge status: Home / Self Care | Payer: Self-pay | Source: Home / Self Care | Attending: Urology

## 2021-08-01 ENCOUNTER — Other Ambulatory Visit: Payer: Self-pay

## 2021-08-01 ENCOUNTER — Ambulatory Visit (HOSPITAL_COMMUNITY): Payer: Medicare HMO | Admitting: Physician Assistant

## 2021-08-01 ENCOUNTER — Encounter (HOSPITAL_COMMUNITY): Payer: Self-pay | Admitting: Urology

## 2021-08-01 ENCOUNTER — Ambulatory Visit (HOSPITAL_BASED_OUTPATIENT_CLINIC_OR_DEPARTMENT_OTHER): Payer: Medicare HMO | Admitting: Registered Nurse

## 2021-08-01 ENCOUNTER — Observation Stay (HOSPITAL_COMMUNITY)
Admission: RE | Admit: 2021-08-01 | Discharge: 2021-08-02 | Disposition: A | Discharge status: Home / Self Care | Payer: Medicare HMO | Attending: Urology | Admitting: Urology

## 2021-08-01 DIAGNOSIS — Z808 Family history of malignant neoplasm of other organs or systems: Secondary | ICD-10-CM | POA: Insufficient documentation

## 2021-08-01 DIAGNOSIS — I10 Essential (primary) hypertension: Secondary | ICD-10-CM

## 2021-08-01 DIAGNOSIS — K449 Diaphragmatic hernia without obstruction or gangrene: Secondary | ICD-10-CM | POA: Diagnosis not present

## 2021-08-01 DIAGNOSIS — M1990 Unspecified osteoarthritis, unspecified site: Secondary | ICD-10-CM | POA: Diagnosis not present

## 2021-08-01 DIAGNOSIS — Z79899 Other long term (current) drug therapy: Secondary | ICD-10-CM | POA: Insufficient documentation

## 2021-08-01 DIAGNOSIS — C678 Malignant neoplasm of overlapping sites of bladder: Secondary | ICD-10-CM

## 2021-08-01 DIAGNOSIS — D291 Benign neoplasm of prostate: Secondary | ICD-10-CM | POA: Insufficient documentation

## 2021-08-01 DIAGNOSIS — N320 Bladder-neck obstruction: Secondary | ICD-10-CM

## 2021-08-01 DIAGNOSIS — N138 Other obstructive and reflux uropathy: Secondary | ICD-10-CM | POA: Diagnosis not present

## 2021-08-01 DIAGNOSIS — R338 Other retention of urine: Secondary | ICD-10-CM | POA: Diagnosis not present

## 2021-08-01 DIAGNOSIS — R339 Retention of urine, unspecified: Secondary | ICD-10-CM | POA: Diagnosis not present

## 2021-08-01 DIAGNOSIS — N401 Enlarged prostate with lower urinary tract symptoms: Secondary | ICD-10-CM | POA: Diagnosis not present

## 2021-08-01 DIAGNOSIS — Z8546 Personal history of malignant neoplasm of prostate: Secondary | ICD-10-CM | POA: Diagnosis not present

## 2021-08-01 HISTORY — PX: TRANSURETHRAL RESECTION OF PROSTATE: SHX73

## 2021-08-01 LAB — GLUCOSE, CAPILLARY
Glucose-Capillary: 112 mg/dL — ABNORMAL HIGH (ref 70–99)
Glucose-Capillary: 141 mg/dL — ABNORMAL HIGH (ref 70–99)

## 2021-08-01 SURGERY — TURP (TRANSURETHRAL RESECTION OF PROSTATE)
Anesthesia: General

## 2021-08-01 MED ORDER — ONDANSETRON HCL 4 MG/2ML IJ SOLN: 4.0000 mg | Freq: Four times a day (QID) | INTRAMUSCULAR | Status: DC | PRN

## 2021-08-01 MED ORDER — ONDANSETRON HCL 4 MG/2ML IJ SOLN
INTRAMUSCULAR | Status: AC
  Filled 2021-08-01: qty 2

## 2021-08-01 MED ORDER — ATENOLOL 50 MG PO TABS
100.0000 mg | ORAL_TABLET | Freq: Two times a day (BID) | ORAL | Status: DC
  Administered 2021-08-01 – 2021-08-02 (×2): 100 mg via ORAL
  Filled 2021-08-01 (×2): qty 2

## 2021-08-01 MED ORDER — FENTANYL CITRATE (PF) 100 MCG/2ML IJ SOLN
INTRAMUSCULAR | Status: DC | PRN
  Administered 2021-08-01: 50 ug via INTRAVENOUS
  Administered 2021-08-01 (×2): 25 ug via INTRAVENOUS

## 2021-08-01 MED ORDER — LIDOCAINE HCL (PF) 2 % IJ SOLN
INTRAMUSCULAR | Status: AC
  Filled 2021-08-01: qty 5

## 2021-08-01 MED ORDER — FENTANYL CITRATE (PF) 100 MCG/2ML IJ SOLN
INTRAMUSCULAR | Status: AC
  Filled 2021-08-01: qty 2

## 2021-08-01 MED ORDER — PROPOFOL 10 MG/ML IV BOLUS
INTRAVENOUS | Status: DC | PRN
  Administered 2021-08-01: 180 mg via INTRAVENOUS

## 2021-08-01 MED ORDER — CHLORHEXIDINE GLUCONATE 0.12 % MT SOLN
15.0000 mL | Freq: Once | OROMUCOSAL | Status: AC
  Administered 2021-08-01: 15 mL via OROMUCOSAL

## 2021-08-01 MED ORDER — EPHEDRINE SULFATE-NACL 50-0.9 MG/10ML-% IV SOSY
PREFILLED_SYRINGE | INTRAVENOUS | Status: DC | PRN
  Administered 2021-08-01 (×3): 5 mg via INTRAVENOUS

## 2021-08-01 MED ORDER — DOCUSATE SODIUM 100 MG PO CAPS
100.0000 mg | ORAL_CAPSULE | Freq: Two times a day (BID) | ORAL | Status: DC
  Administered 2021-08-01 – 2021-08-02 (×2): 100 mg via ORAL
  Filled 2021-08-01 (×2): qty 1

## 2021-08-01 MED ORDER — LIDOCAINE 2% (20 MG/ML) 5 ML SYRINGE
INTRAMUSCULAR | Status: DC | PRN
Start: 2021-08-01 — End: 2021-08-01
  Administered 2021-08-01: 80 mg via INTRAVENOUS

## 2021-08-01 MED ORDER — HYDROMORPHONE HCL 1 MG/ML IJ SOLN: 0.5000 mg | INTRAMUSCULAR | Status: DC | PRN

## 2021-08-01 MED ORDER — ONDANSETRON HCL 4 MG/2ML IJ SOLN
INTRAMUSCULAR | Status: DC | PRN
Start: 2021-08-01 — End: 2021-08-01
  Administered 2021-08-01: 4 mg via INTRAVENOUS

## 2021-08-01 MED ORDER — HYDROCHLOROTHIAZIDE 25 MG PO TABS
25.0000 mg | ORAL_TABLET | Freq: Every morning | ORAL | Status: DC
  Administered 2021-08-01 – 2021-08-02 (×2): 25 mg via ORAL
  Filled 2021-08-01 (×2): qty 1

## 2021-08-01 MED ORDER — DEXAMETHASONE SODIUM PHOSPHATE 10 MG/ML IJ SOLN
INTRAMUSCULAR | Status: AC
  Filled 2021-08-01: qty 1

## 2021-08-01 MED ORDER — CEFAZOLIN SODIUM-DEXTROSE 2-4 GM/100ML-% IV SOLN
2.0000 g | INTRAVENOUS | Status: AC
  Administered 2021-08-01: 2 g via INTRAVENOUS
  Filled 2021-08-01: qty 100

## 2021-08-01 MED ORDER — SODIUM CHLORIDE 0.9 % IR SOLN
Status: DC | PRN
  Administered 2021-08-01: 42000 mL
  Administered 2021-08-01: 3000 mL

## 2021-08-01 MED ORDER — FENTANYL CITRATE PF 50 MCG/ML IJ SOSY
PREFILLED_SYRINGE | INTRAMUSCULAR | Status: AC
  Filled 2021-08-01: qty 1

## 2021-08-01 MED ORDER — TRAMADOL HCL 50 MG PO TABS
50.0000 mg | ORAL_TABLET | Freq: Four times a day (QID) | ORAL | Status: DC | PRN
  Administered 2021-08-02: 50 mg via ORAL
  Filled 2021-08-01 (×2): qty 1

## 2021-08-01 MED ORDER — SODIUM CHLORIDE 0.9 % IR SOLN
3000.0000 mL | Status: DC
  Administered 2021-08-01 (×3): 3000 mL

## 2021-08-01 MED ORDER — ONDANSETRON HCL 4 MG PO TABS
4.0000 mg | ORAL_TABLET | Freq: Three times a day (TID) | ORAL | Status: DC | PRN
Start: 2021-08-01 — End: 2021-08-02

## 2021-08-01 MED ORDER — FENTANYL CITRATE PF 50 MCG/ML IJ SOSY
25.0000 ug | PREFILLED_SYRINGE | INTRAMUSCULAR | Status: DC | PRN
  Administered 2021-08-01: 50 ug via INTRAVENOUS

## 2021-08-01 MED ORDER — ORAL CARE MOUTH RINSE: 15.0000 mL | Freq: Once | OROMUCOSAL | Status: AC

## 2021-08-01 MED ORDER — 0.9 % SODIUM CHLORIDE (POUR BTL) OPTIME
TOPICAL | Status: DC | PRN
  Administered 2021-08-01: 1000 mL

## 2021-08-01 MED ORDER — TRAZODONE HCL 100 MG PO TABS
100.0000 mg | ORAL_TABLET | Freq: Every day | ORAL | Status: DC
  Administered 2021-08-01: 100 mg via ORAL
  Filled 2021-08-01: qty 1

## 2021-08-01 MED ORDER — MIDAZOLAM HCL 5 MG/5ML IJ SOLN
INTRAMUSCULAR | Status: DC | PRN
  Administered 2021-08-01: 2 mg via INTRAVENOUS

## 2021-08-01 MED ORDER — TRAMADOL HCL 50 MG PO TABS: 50.0000 mg | ORAL_TABLET | Freq: Four times a day (QID) | ORAL | 0 refills | Status: DC | PRN

## 2021-08-01 MED ORDER — LACTATED RINGERS IV SOLN
INTRAVENOUS | Status: DC
Start: 2021-08-01 — End: 2021-08-01

## 2021-08-01 MED ORDER — PROPOFOL 10 MG/ML IV BOLUS
INTRAVENOUS | Status: AC
Start: 2021-08-01 — End: ?
  Filled 2021-08-01: qty 20

## 2021-08-01 MED ORDER — SENNA 8.6 MG PO TABS: 1.0000 | ORAL_TABLET | Freq: Every day | ORAL | Status: DC | PRN

## 2021-08-01 MED ORDER — MIDAZOLAM HCL 2 MG/2ML IJ SOLN
INTRAMUSCULAR | Status: AC
  Filled 2021-08-01: qty 2

## 2021-08-01 MED ORDER — DEXAMETHASONE SODIUM PHOSPHATE 10 MG/ML IJ SOLN
INTRAMUSCULAR | Status: DC | PRN
  Administered 2021-08-01: 10 mg via INTRAVENOUS

## 2021-08-01 MED ORDER — SODIUM CHLORIDE 0.45 % IV SOLN
INTRAVENOUS | Status: DC
Start: 2021-08-01 — End: 2021-08-02

## 2021-08-01 MED ORDER — RAMIPRIL 10 MG PO CAPS
10.0000 mg | ORAL_CAPSULE | Freq: Every morning | ORAL | Status: DC
  Administered 2021-08-01 – 2021-08-02 (×2): 10 mg via ORAL
  Filled 2021-08-01 (×2): qty 1

## 2021-08-01 SURGICAL SUPPLY — 19 items
BAG URINE DRAIN 2000ML AR STRL (UROLOGICAL SUPPLIES) ×2 IMPLANT
BAG URO CATCHER STRL LF (MISCELLANEOUS) ×2 IMPLANT
CATH FOLEY 3WAY 30CC 22FR (CATHETERS) IMPLANT
CATH FOLEY 3WAY 30CC 24FR (CATHETERS) ×1
CATH URTH STD 24FR FL 3W 2 (CATHETERS) IMPLANT
DRAPE FOOT SWITCH (DRAPES) ×2 IMPLANT
ELECT REM PT RETURN 15FT ADLT (MISCELLANEOUS) ×1 IMPLANT
GLOVE SURG ENC MOIS LTX SZ7.5 (GLOVE) ×2 IMPLANT
GOWN STRL REUS W/TWL XL LVL3 (GOWN DISPOSABLE) ×2 IMPLANT
HOLDER FOLEY CATH W/STRAP (MISCELLANEOUS) ×1 IMPLANT
KIT TURNOVER KIT A (KITS) IMPLANT
LOOP CUT BIPOLAR 24F LRG (ELECTROSURGICAL) ×1 IMPLANT
MANIFOLD NEPTUNE II (INSTRUMENTS) ×2 IMPLANT
PACK CYSTO (CUSTOM PROCEDURE TRAY) ×2 IMPLANT
SYR 30ML LL (SYRINGE) IMPLANT
SYR TOOMEY IRRIG 70ML (MISCELLANEOUS) ×2
SYRINGE TOOMEY IRRIG 70ML (MISCELLANEOUS) ×1 IMPLANT
TUBING CONNECTING 10 (TUBING) ×2 IMPLANT
TUBING UROLOGY SET (TUBING) ×2 IMPLANT

## 2021-08-01 NOTE — Transfer of Care (Signed)
Immediate Anesthesia Transfer of Care Note ? ?Patient: Shane Cruz. ? ?Procedure(s) Performed: TRANSURETHRAL RESECTION OF THE PROSTATE (TURP) ? ?Patient Location: PACU ? ?Anesthesia Type:General ? ?Level of Consciousness: awake, alert , oriented and patient cooperative ? ?Airway & Oxygen Therapy: Patient Spontanous Breathing and Patient connected to face mask ? ?Post-op Assessment: Report given to RN, Post -op Vital signs reviewed and stable and Patient moving all extremities ? ?Post vital signs: Reviewed and stable ? ?Last Vitals:  ?Vitals Value Taken Time  ?BP 128/87 08/01/21 1308  ?Temp    ?Pulse 75 08/01/21 1310  ?Resp 9 08/01/21 1310  ?SpO2 100 % 08/01/21 1310  ?Vitals shown include unvalidated device data. ? ?Last Pain:  ?Vitals:  ? 08/01/21 0904  ?TempSrc:   ?PainSc: 0-No pain  ?   ? ?  ? ?Complications: No notable events documented. ?

## 2021-08-01 NOTE — H&P (Signed)
f/u for Prostate Cancer  ?HPI: Shane Cruz is a 71 year-old male established patient who is here for f/u while on Active Surveillance for Prostate Cancer . ? ?The patient was last seen 5/22.  ? ?He was diagnosed with prostate cancer in 04/22/2019. At the time of his prostate cancer diagnosis his PSA was 6.5. The patient's Gleason score at the time of diagnosis was 3+3. There were 1 positive cores on his initial biospy. He has had 2 biopsy(ies). The patient's most recent biopsy was 10/19/2020.  ? ?The patient has had a prostate MRI. His prostate MRI was in 12/20/2018. This showed: PIRAD-4 x 3 - all biopsied, all negative.  ? ?His most recent PSA is 6.45. This was drawn on approximately 06/17/2021.  ? ?He does have problems with erections. He does not have urinary incontinence. He does have an abnormal sensation when he needs to urinate. He does have to strain or bear down to start his urinary stream.  ? ?He is not having pain in new locations. He does have a good appetite. His bowels are moving normally. He has not seen blood in his stool since the biopsy. He has not recently had unwanted weight loss.  ? ?Earlier this fall the patient reports worsening lower urinary tract symptoms. Specifically the patient states that about he had severe problems with initiating his stream and getting his bladder empty. He was having some discomfort with urination and felt severely constipated. He did self medicate with some old antibiotics and felt as if his symptoms improved. For the most part, the patient's biggest complaint is weak stream, intermittency, and urinary frequency. He does not have a history of recurring infections. He does not have a history of hematuria.  ? ?Patient here today for active surveillance of his prostate cancer. His PSA is unchanged. His voiding symptoms are significant, but remain unchanged as well. He like to discuss BPH surgeries as well.  ? ?Interval: Today the patient returns after undergoing a  repeat prostate biopsy. This demonstrated no evidence of cancer. He did have a biopsy prior that demonstrated low-grade prostate cancer. He had a prostatitis/prostate infection following the biopsy. He seems to have recovered. He continues to struggle with frequency, weak stream, intermittency, and the feeling of incomplete bladder emptying. These are his baseline symptoms.  ? ? ?  ?AUA Symptom Score: Less than 50% of the time he has the sensation of not emptying his bladder completely when finished urinating. He never has to urinate again less that two hours after he has finished urinating. 50% of the time he has to start and stop again several times when he urinates. More than 50% of the time he finds it difficult to postpone urination. Less than 50% of the time he has a weak urinary stream. He never has to push or strain to begin urination. He has to get up to urinate 2 times from the time he goes to bed until the time he gets up in the morning.  ? ?Calculated AUA Symptom Score: 13 ? ?  ?ALLERGIES: No Allergies ?  ? ?MEDICATIONS: Doxycycline Hyclate  ?Metformin Hcl 1,000 mg tablet  ?Tamsulosin Hcl 0.4 mg capsule  ?Altace 10 mg tablet 0 Oral  ?Ambien 10 mg tablet 1 tablet PO Q HS PRN  ?Aspirin Ec 81 mg tablet, delayed release Oral  ?Atenolol 100 mg tablet Oral  ?Cal-Mag TABS Oral  ?Diazepam 5 mg tablet Oral  ?Fish Oil  ?Garlic CAPS Oral  ?Hydrochlorothiazide 25 mg tablet Oral  ?  Multivitamins tablet Oral  ?Omeprazole 20 mg tablet, delayed release Oral  ?Ramipril 10 mg capsule  ?Trazodone Hcl 100 mg tablet  ?Vitamin D3 1,250 mcg (50,000 unit) capsule Oral  ?Zinc  ?Zocor 40 mg tablet Oral  ?  ? ?GU PSH: Cystoscopy - 05/09/2019 ?Prostate Needle Biopsy - 09/24/2020, 04/20/2019, 2017 ? ?  ?   ?PSH Notes: Hernia Repair, Oral Surgery Tooth Extraction, Breast Surgery, cysts removed from back (2018)  ? ?NON-GU PSH: Breast Surgery Procedure - 2015 ?Dental Surgery Procedure - 2015 ?Hernia Repair - 2015 ?Surgical Pathology,  Gross And Microscopic Examination For Prostate Needle - 09/24/2020, 04/20/2019, 2017 ? ?  ? ?GU PMH: Prostate Cancer - 10/14/2020, - 09/24/2020, - 08/19/2020, - 05/08/2019 ?Acute Cystitis/UTI - 10/01/2020 ?BPH w/LUTS - 05/09/2019 ?Urinary Frequency - 02/16/2019 ?Weak Urinary Stream - 02/16/2019 ?Elevated PSA - 2018, Elevated prostate specific antigen (PSA), - 2015 ?Urinary Obstruction - 2018 ?  ? ?NON-GU PMH: Encounter for general adult medical examination without abnormal findings, Encounter for preventive health examination - 2016 ?Personal history of other diseases of the circulatory system, History of viral myocarditis - 2015 ?Personal history of other diseases of the digestive system, History of esophageal reflux - 2015 ?Personal history of other diseases of the musculoskeletal system and connective tissue, History of arthritis - 2015 ?  ? ?FAMILY HISTORY: Colon Cancer - Runs In Family ?Death of family member - Runs In Family  ? ?SOCIAL HISTORY: Marital Status: Married ?Preferred Language: [place]; Ethnicity: Not Hispanic Or Latino; Race: White ?  ?  Notes: Engaged to be married, Married, Occupation, Caffeine use, Number of children, Alcohol use  ? ?REVIEW OF SYSTEMS:    ?GU Review Male:   Patient denies erection problems, hard to postpone urination, have to strain to urinate , get up at night to urinate, penile pain, frequent urination, leakage of urine, trouble starting your stream, burning/ pain with urination, and stream starts and stops.  ?Gastrointestinal (Upper):   Patient denies nausea, vomiting, and indigestion/ heartburn.  ?Gastrointestinal (Lower):   Patient denies diarrhea and constipation.  ?Constitutional:   Patient denies fever, night sweats, weight loss, and fatigue.  ?Skin:   Patient denies skin rash/ lesion and itching.  ?Eyes:   Patient denies blurred vision and double vision.  ?Ears/ Nose/ Throat:   Patient denies sore throat and sinus problems.  ?Hematologic/Lymphatic:   Patient denies swollen glands  and easy bruising.  ?Cardiovascular:   Patient denies leg swelling and chest pains.  ?Respiratory:   Patient denies cough and shortness of breath.  ?Endocrine:   Patient denies excessive thirst.  ?Musculoskeletal:   Patient denies back pain and joint pain.  ?Neurological:   Patient denies headaches and dizziness.  ?Psychologic:   Patient denies depression and anxiety.  ? ?VITAL SIGNS:    ?  06/24/2021 01:36 PM  ?Weight 71 lb / 32.21 kg  ?Height 190 in / 482.6 cm  ?BP 106/69 mmHg  ?Pulse 92 /min  ?Temperature 98.2 F / 36.7 C  ?BMI 1.4 kg/m?  ? ?Complexity of Data:  ?Source Of History:  Patient  ?Lab Test Review:   PSA  ?Records Review:   Pathology Reports, Previous Doctor Records, Previous Patient Records, POC Tool  ?Urine Test Review:   Urinalysis  ? 06/17/21 02/17/19 02/09/17  ?PSA  ?Total PSA 6.45 ng/mL 6.59 ng/mL 5.9 ng/dl  ? ? ?PROCEDURES:    ? ?     Urinalysis - 81003 ?Dipstick Dipstick Cont'd  ?Color: Yellow Bilirubin: Neg  ?Appearance: Clear  Ketones: Neg  ?Specific Gravity: 1.020 Blood: Neg  ?pH: <=5.0 Protein: Neg  ?Glucose: Neg Urobilinogen: 0.2  ?  Nitrites: Neg  ?  Leukocyte Esterase: Neg  ? ? ?ASSESSMENT:  ?    ICD-10 Details  ?1 GU:   Prostate Cancer - C61   ?2   BPH w/LUTS - N40.1   ?3   Urinary Frequency - R35.0   ?4   Weak Urinary Stream - R39.12   ?  ? ?PLAN:    ? ?      Document ?Letter(s):  Created for Patient: Clinical Summary  ? ? ?     Notes:   The patient has low risk prostate cancer on active surveillance. His PSA is unchanged. He wants to continue on active surveillance, I think this is reasonable.  ? ?The patient I had a long discussion about his urinary tract symptoms again. In the previous consultation we discussed TURP versus HoLEP. The patient has opted to proceed with TURP. I went through the surgery with him in detail. We discussed the expected hospitalization and postoperative course. We discussed the risks and the benefits. Hopefully this will improve his urinary tract symptoms  significantly. We also discussed the risks of bleeding and failed voiding trial. Having gone through everything, he is willing to proceed. We will try to get this scheduled for him at his convenience.  ? ? ?

## 2021-08-01 NOTE — Discharge Instructions (Signed)

## 2021-08-01 NOTE — Interval H&P Note (Signed)
History and Physical Interval Note: ? ?08/01/2021 ?11:09 AM ? ?Shane Cruz [name].  has presented today for surgery, with the diagnosis of BLADDER OUTLET OBSTRUCTION.  The various methods of treatment have been discussed with the patient and family. After consideration of risks, benefits and other options for treatment, the patient has consented to  Procedure(s): ?[place] (TURP) (N/A) as a surgical intervention.  The patient's history has been reviewed, patient examined, no change in status, stable for surgery.  I have reviewed the patient's chart and labs.  Questions were answered to the patient's satisfaction.   ? ? ?Ardis Hughs ? ? ?

## 2021-08-01 NOTE — Plan of Care (Signed)
  Problem: Health Behavior/Discharge Planning: Goal: Ability to manage health-related needs will improve Outcome: Progressing   Problem: Activity: Goal: Risk for activity intolerance will decrease Outcome: Progressing   

## 2021-08-01 NOTE — Anesthesia Procedure Notes (Addendum)
Procedure Name: LMA Insertion ?Date/Time: 08/01/2021 11:17 AM ?Performed by: Victoriano Lain, CRNA ?Pre-anesthesia Checklist: Patient identified, Emergency Drugs available, Suction available, Patient being monitored and Timeout performed ?Patient Re-evaluated:Patient Re-evaluated prior to induction ?Oxygen Delivery Method: Circle system utilized ?Preoxygenation: Pre-oxygenation with 100% oxygen ?Induction Type: IV induction ?Ventilation: Mask ventilation without difficulty ?LMA: LMA with gastric port inserted ?LMA Size: 4.0 ?Number of attempts: 1 ?Placement Confirmation: positive ETCO2 and breath sounds checked- equal and bilateral ?Tube secured with: Tape ?Dental Injury: Teeth and Oropharynx as per pre-operative assessment  ?Comments: Placed by Ned Grace ? ? ? ? ?

## 2021-08-01 NOTE — Anesthesia Preprocedure Evaluation (Addendum)
Anesthesia Evaluation  ?Patient identified by MRN, date of birth, ID band ?Patient awake ? ? ? ?Reviewed: ?Allergy & Precautions, NPO status , Patient's Chart, lab work & pertinent test results ? ?Airway ?Mallampati: II ? ?TM Distance: >3 FB ?Neck ROM: Full ? ? ? Dental ?no notable dental hx. ? ?  ?Pulmonary ?neg pulmonary ROS, former smoker,  ?  ?Pulmonary exam normal ? ? ? ? ? ? ? Cardiovascular ?hypertension, Pt. on medications and Pt. on home beta blockers ? ?Rhythm:Regular Rate:Normal ? ? ?  ?Neuro/Psych ?negative neurological ROS ? negative psych ROS  ? GI/Hepatic ?Neg liver ROS, hiatal hernia, GERD  Medicated,  ?Endo/Other  ?negative endocrine ROS ? Renal/GU ?negative Renal ROS  ? ?Bladder outlet obstruction ? ?  ?Musculoskeletal ? ?(+) Arthritis ,  ? Abdominal ?Normal abdominal exam  (+)   ?Peds ? Hematology ?negative hematology ROS ?(+)   ?Anesthesia Other Findings ? ? Reproductive/Obstetrics ? ?  ? ? ? ? ? ? ? ? ? ? ? ? ? ?  ?  ? ? ? ? ? ? ? ?Anesthesia Physical ?Anesthesia Plan ? ?ASA: 3 ? ?Anesthesia Plan: General  ? ?Post-op Pain Management:   ? ?Induction: Intravenous ? ?PONV Risk Score and Plan: 2 and Ondansetron, Dexamethasone and Treatment may vary due to age or medical condition ? ?Airway Management Planned: Mask and LMA ? ?Additional Equipment: None ? ?Intra-op Plan:  ? ?Post-operative Plan: Extubation in OR ? ?Informed Consent: I have reviewed the patients History and Physical, chart, labs and discussed the procedure including the risks, benefits and alternatives for the proposed anesthesia with the patient or authorized representative who has indicated his/her understanding and acceptance.  ? ? ? ?Dental advisory given ? ?Plan Discussed with: CRNA ? ?Anesthesia Plan Comments: (Lab Results ?     Component                Value               Date                 ?     WBC                      6.5                 07/21/2021           ?     HGB                       15.1                07/21/2021           ?     HCT                      46.3                07/21/2021           ?     MCV                      83.7                07/21/2021           ?     PLT  213                 07/21/2021           ?Lab Results ?     Component                Value               Date                 ?     NA                       137                 07/21/2021           ?     K                        4.4                 07/21/2021           ?     CO2                      26                  07/21/2021           ?     GLUCOSE                  105 (H)             07/21/2021           ?     BUN                      23                  07/21/2021           ?     CREATININE               1.20                07/21/2021           ?     CALCIUM                  9.0                 07/21/2021           ?     GFRNONAA                 >60                 07/21/2021           ?07/21/2021 ?Rate 74 bpm  ?Sinus rhythm with 1st degree A-V block ?RSR' or QR pattern in V1 suggests right ventricular conduction delay ?Minimal voltage criteria for LVH, may be normal variant ( R in aVL ) ?Borderline ECG ?? ?CV: ?Echo 05/20/2015 ?The study was technically adequate. A complete two-dimensional transthoracic echocardiogram was performed. The left ventricle is normal in size.  ?There is mild concentric left ventricular hypertrophy with normal wall motion and ejection fraction 55-60%.  ?Grade I mild diastolic dysfunction; abnormal relaxation pattern.  ?The left atrium is moderately dilated.  ?Right  ventricular systolic pressure is normal. )  ? ? ? ? ? ? ?Anesthesia Quick Evaluation ? ?

## 2021-08-02 DIAGNOSIS — R339 Retention of urine, unspecified: Secondary | ICD-10-CM | POA: Diagnosis not present

## 2021-08-02 DIAGNOSIS — N320 Bladder-neck obstruction: Secondary | ICD-10-CM | POA: Diagnosis not present

## 2021-08-02 DIAGNOSIS — D291 Benign neoplasm of prostate: Secondary | ICD-10-CM | POA: Diagnosis not present

## 2021-08-02 DIAGNOSIS — Z808 Family history of malignant neoplasm of other organs or systems: Secondary | ICD-10-CM | POA: Diagnosis not present

## 2021-08-02 DIAGNOSIS — Z79899 Other long term (current) drug therapy: Secondary | ICD-10-CM | POA: Diagnosis not present

## 2021-08-02 DIAGNOSIS — Z8546 Personal history of malignant neoplasm of prostate: Secondary | ICD-10-CM | POA: Diagnosis not present

## 2021-08-02 LAB — BASIC METABOLIC PANEL
Anion gap: 8 (ref 5–15)
BUN: 18 mg/dL (ref 8–23)
CO2: 25 mmol/L (ref 22–32)
Calcium: 8.7 mg/dL — ABNORMAL LOW (ref 8.9–10.3)
Chloride: 101 mmol/L (ref 98–111)
Creatinine, Ser: 1.01 mg/dL (ref 0.61–1.24)
GFR, Estimated: >60 mL/min (ref >60)
Glucose, Bld: 122 mg/dL — ABNORMAL HIGH (ref 70–99)
Potassium: 3.7 mmol/L (ref 3.5–5.1)
Sodium: 134 mmol/L — ABNORMAL LOW (ref 135–145)

## 2021-08-02 LAB — CBC
HCT: 40.8 % (ref 39.0–52.0)
Hemoglobin: 13.4 g/dL (ref 13.0–17.0)
MCH: 26.9 pg (ref 26.0–34.0)
MCHC: 32.8 g/dL (ref 30.0–36.0)
MCV: 81.8 fL (ref 80.0–100.0)
Platelets: 206 10*3/uL (ref 150–400)
RBC: 4.99 MIL/uL (ref 4.22–5.81)
RDW: 14.7 % (ref 11.5–15.5)
WBC: 12.9 10*3/uL — ABNORMAL HIGH (ref 4.0–10.5)
nRBC: 0 % (ref 0.0–0.2)

## 2021-08-02 NOTE — Anesthesia Postprocedure Evaluation (Signed)
Anesthesia Post Note ? ?Patient: Shane Cruz. ? ?Procedure(s) Performed: TRANSURETHRAL RESECTION OF THE PROSTATE (TURP) ? ?  ? ?Patient location during evaluation: PACU ?Anesthesia Type: General ?Level of consciousness: awake and alert ?Pain management: pain level controlled ?Vital Signs Assessment: post-procedure vital signs reviewed and stable ?Respiratory status: spontaneous breathing, nonlabored ventilation, respiratory function stable and patient connected to nasal cannula oxygen ?Cardiovascular status: blood pressure returned to baseline and stable ?Postop Assessment: no apparent nausea or vomiting ?Anesthetic complications: no ? ? ?No notable events documented. ? ?Last Vitals:  ?Vitals:  ? 08/01/21 2341 08/02/21 0315  ?BP: 123/85 116/78  ?Pulse: 98 91  ?Resp: 18 15  ?Temp: 36.9 ?C 36.7 ?C  ?SpO2: 91% 93%  ?  ?Last Pain:  ?Vitals:  ? 08/02/21 0920  ?TempSrc:   ?PainSc: 2   ? ? ?  ?  ?  ?  ?  ?  ? ?[name] ? ? ? ? ?

## 2021-08-02 NOTE — Discharge Summary (Signed)
[facility] Discharge Summary ? ?Admit date: 08/01/2021 ? ?Discharge date and time: 08/02/21  ? ?Discharge to: Home ? ?Discharge Service: Urology ? ?Discharge Attending Physician:  Dr. Festus Aloe ? ?Discharge  Diagnoses: Bladder outlet obstruction ? ?Secondary Diagnosis: Principal Problem: ?  Bladder outlet obstruction ? ? ?OR Procedures: Procedure(s): ?TRANSURETHRAL RESECTION OF THE PROSTATE (TURP) 08/01/2021 ?  ?Ancillary Procedures: None  ? ?Discharge Day Services: ?The patient was seen and examined by the Urology team both in the morning and immediately prior to discharge.  Vital signs and laboratory values were stable and within normal limits.  The physical exam was benign and unchanged and all surgical wounds were examined.  Discharge instructions were explained and all questions answered. ? ?Subjective  ?No acute events overnight. Pain Controlled. No fever or chills. ? ?Objective ?Patient Vitals for the past 8 hrs: ? BP Temp Temp src Pulse Resp SpO2  ?08/02/21 0315 116/78 98 ?F (36.7 ?C) Oral 91 15 93 %  ? ?Total I/O ?In: 600 [P.O.:600] ?Out: -  ? ?General Appearance:        No acute distress ?Lungs:                       Normal work of breathing on room air ?Heart:                                Regular rate and rhythm ?Abdomen:                         Soft, non-tender, non-distended ?GU:                   Voiding spontaneously ?Extremities:                      Warm and well perfused ? ? ?Hospital Course:  ?The patient underwent transurethral resection of the prostate on 08/01/2021.  The patient tolerated the procedure well, was extubated in the OR, and afterwards was taken to the PACU for routine post-surgical care. When stable the patient was transferred to the floor.   The patient did well postoperatively.  The patient?s diet was slowly advanced and at the time of discharge was tolerating a regular diet.  The patient was discharged home 1 Day Post-Op, at which point was tolerating a regular solid  diet, was able to void spontaneously, have adequate pain control with P.O. pain medication, and could ambulate without difficulty. The patient will follow up with [place] for post op check.  ? ?Condition at Discharge: Improved ? ?Discharge Medications:  ?Allergies as of 08/02/2021   ?No Known Allergies ?  ? ?  ?Medication List  ?  ? ?TAKE these medications   ? ?acetaminophen 650 MG CR tablet ?Commonly known as: TYLENOL ?Take 650 mg by mouth daily as needed for pain. ?  ?atenolol 100 MG tablet ?Commonly known as: TENORMIN ?Take 100 mg by mouth 2 (two) times daily. ?  ?CALCIUM CARB-MAGNESIUM CARB PO ?Take 1 tablet by mouth every other day. ?  ?diclofenac Sodium 1 % Gel ?Commonly known as: VOLTAREN ?Apply 1 application topically daily as needed (joint pain). ?  ?fish oil-omega-3 fatty acids 1000 MG capsule ?Take 1 g by mouth daily. ?  ?hydrochlorothiazide 25 MG tablet ?Commonly known as: HYDRODIURIL ?Take 25 mg by mouth every morning. ?  ?metFORMIN 1000 MG tablet ?Commonly known as:  GLUCOPHAGE ?Take 1,000 mg by mouth 2 (two) times daily. ?  ?multivitamin with minerals Tabs tablet ?Take 1 tablet by mouth every morning. ?  ?omeprazole 20 MG capsule ?Commonly known as: PRILOSEC ?Take 20 mg by mouth daily as needed (acid reflux). ?  ?ramipril 10 MG capsule ?Commonly known as: ALTACE ?Take 10 mg by mouth every morning. ?  ?simvastatin 40 MG tablet ?Commonly known as: ZOCOR ?Take 40 mg by mouth at bedtime. ?  ?traMADol 50 MG tablet ?Commonly known as: Ultram ?Take 1-2 tablets (50-100 mg total) by mouth every 6 (six) hours as needed for moderate pain. ?  ?traZODone 100 MG tablet ?Commonly known as: DESYREL ?Take 100 mg by mouth at bedtime. ?  ?TURMERIC CURCUMIN PO ?Take 1 capsule by mouth daily as needed (pain). ?  ?Vitamin D3 50 MCG (2000 UT) Tabs ?Take 2,000 Units by mouth at bedtime. ?  ?ZINC-VITAMIN C PO ?Take 1 tablet by mouth daily as needed (immune system boost). ?  ? ?  ? ? ? ?

## 2021-08-02 NOTE — Progress Notes (Signed)
CBI stopped around 0330 as urine light pink color. At 0520 urine an orangey/pinkish color with one pea-sized clot. Foley d/c and pt tolerated well. Pt educated on string of bottles test.  ?

## 2021-08-02 NOTE — Progress Notes (Signed)
?  Transition of Care (TOC) Screening Note ? ? ?Patient Details  ?Name: Shane Cruz. ?Date of Birth: 06-Sep-1950 ? ? ?Transition of Care [facility] CM/SW Contact:    ?Dessa Phi, RN ?Phone Number: ?08/02/2021, 9:58 AM ? ? ? ?Transition of Care Department [facility]) has reviewed patient and no TOC needs have been identified at this time. We will continue to monitor patient advancement through interdisciplinary progression rounds. If new patient transition needs arise, please place a TOC consult. ? ? ?

## 2021-08-03 ENCOUNTER — Encounter (HOSPITAL_COMMUNITY): Payer: Self-pay | Admitting: Urology

## 2021-08-04 LAB — SURGICAL PATHOLOGY

## 2021-08-05 NOTE — Op Note (Signed)
Preoperative diagnosis:  ?Bladder outlet obstruction ?Urinary retention ? ?Postoperative diagnosis:  ?same  ? ?Procedure:  ?Transurethral resection of prostate ? ?Surgeon: Ardis Hughs, MD  ? ?Anesthesia: General  ? ?Complications: None  ? ?Intraoperative findings: Cystoscopy demonstrated  a normal appearing bladder mucosa without tumor/stones/or abnormalities.  UOs were orthopic.  Prostate demonstrated a large median lobe with lateral lobe obstruction. ? ?EBL: 100cc  ? ?Specimens: Prostate chips ? ?Indication: Shane Cruz. is a 71 y.o. patient with bladder outlet obstruction/urinary retention/obstructive voiding symptoms.  After reviewing the management options for treatment, he elected to proceed with the above surgical procedure(s). We have discussed the potential benefits and risks of the procedure, side effects of the proposed treatment, the likelihood of the patient achieving the goals of the procedure, and any potential problems that might occur during the procedure or recuperation. Informed consent has been obtained. ? ?Description of procedure:  ?The patient was taken to the operating room and general anesthesia was induced. The patient was placed in the dorsal lithotomy position, prepped and draped in the usual sterile fashion, and preoperative antibiotics were administered. A preoperative time-out was performed.  ? ?I then gently passed the 21 French 30? cystoscope into the patient's urethra and up into the bladder under visual guidance. A 360? cystoscopic evaluation was then performed with the above findings.  I then removed the 21 French cystoscopic sheath and replacement with a 26 French resectoscope sheath was passed and using the visual obturator under direct vision. An exchange the obturator for the resectoscope itself and the loop cautery. I then proceeded to create grooves within the prostate at the 7:00 position extending the defect from the bladder neck at 7:00 down to the  prostate apex just lateral to the verumontanum. I took this groove down to the capsule. I then performed a similar maneuver on the patient's left side at the 5:00 position taking the prostate down from the bladder neck to the apex and down the prostatic capsule. At this point I proceeded to resect the patient's right lateral lobe in a systematic fashion moving from the 7:00 position up to approximately 11. The resection was taken down to the prostate capsule. Hemostasis was achieved on this side prior to moving to the patient's left lateral lobe. A similar sequence was performed taking the prostate down from the 5:00 position to approximately the 1:00 position to the level of the prostatic capsule. I then completed the resection of the posterior wall as well as the area between 5:00 and 7:00 along the bladder neck. I was careful not to undermine the bladder neck or resect the inferior. I then did resect the area that was protruding into the prostatic urethra anteriorly.  ? ?Once I was satisfied that the prostate had been adequately resected I evacuated the prostate chips using a [place] syringe. I then reintroduced the resectoscope and ensured adequate hemostasis. I then left the bladder full and removed the resectoscope entirely. Exam under anesthesia demonstrated a normal sized prostate with no nodules.  I then placed a 22 French three-way Foley catheter. I irrigated the catheter gently and removed all debris and clots. I then placed the patient on gentle continuous bladder irrigation. ? ?He subsequently extubated and returned to PACU in excellent condition. ? ?Ardis Hughs, MD  ?

## 2021-08-15 DIAGNOSIS — R3912 Poor urinary stream: Secondary | ICD-10-CM | POA: Diagnosis not present

## 2021-08-15 DIAGNOSIS — R350 Frequency of micturition: Secondary | ICD-10-CM | POA: Diagnosis not present

## 2021-08-15 DIAGNOSIS — N401 Enlarged prostate with lower urinary tract symptoms: Secondary | ICD-10-CM | POA: Diagnosis not present

## 2021-10-01 DIAGNOSIS — M5412 Radiculopathy, cervical region: Secondary | ICD-10-CM | POA: Diagnosis not present

## 2021-11-17 DIAGNOSIS — R3912 Poor urinary stream: Secondary | ICD-10-CM | POA: Diagnosis not present

## 2021-11-17 DIAGNOSIS — C61 Malignant neoplasm of prostate: Secondary | ICD-10-CM | POA: Diagnosis not present

## 2021-11-24 DIAGNOSIS — M47812 Spondylosis without myelopathy or radiculopathy, cervical region: Secondary | ICD-10-CM | POA: Diagnosis not present

## 2021-12-04 IMAGING — MR MR CERVICAL SPINE W/O CM
5 series · 42 of 48 positions shown · non-contrast
Comparison: Cervical spine radiographs 05/10/2020

CLINICAL DATA: Cervical radiculitis. Predominantly right-sided neck
pain.

EXAM:
MRI CERVICAL SPINE WITHOUT CONTRAST
TECHNIQUE: Multiplanar, multisequence MR imaging of the cervical spine was
performed. No intravenous contrast was administered.

[Series 2: T2 · sagittal · 3.0mm · 0.41mm/px · 8 of 17 slices shown (1 of 2)]
[im 1/17]
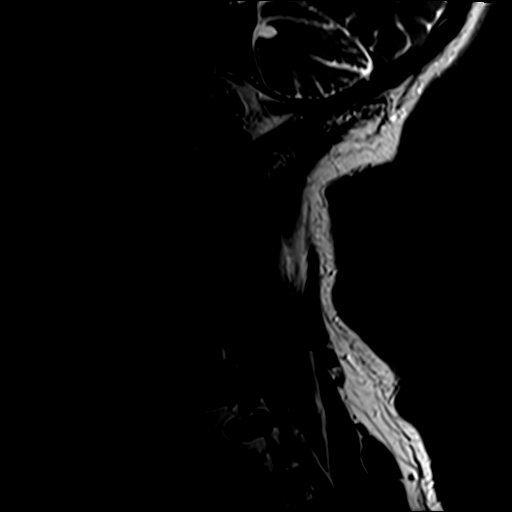
[im 3/17]
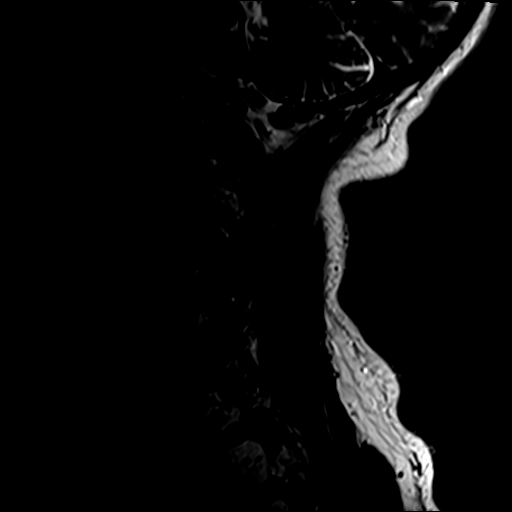
[im 5/17]
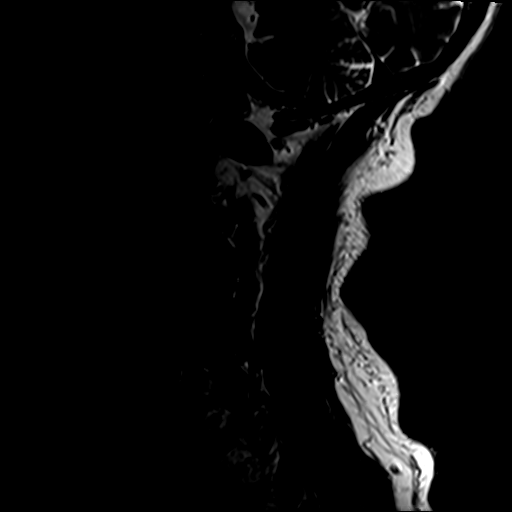
[im 7/17]
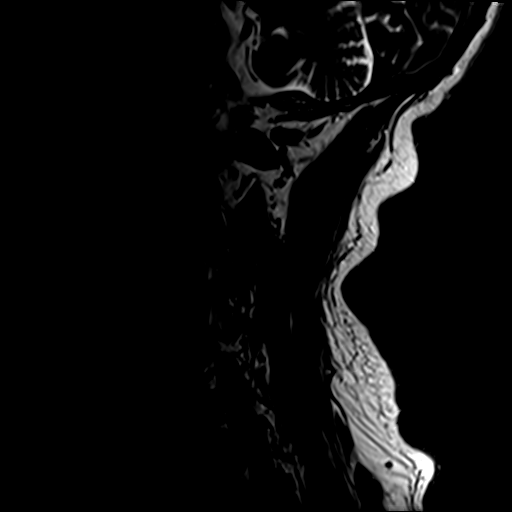
[im 10/17]
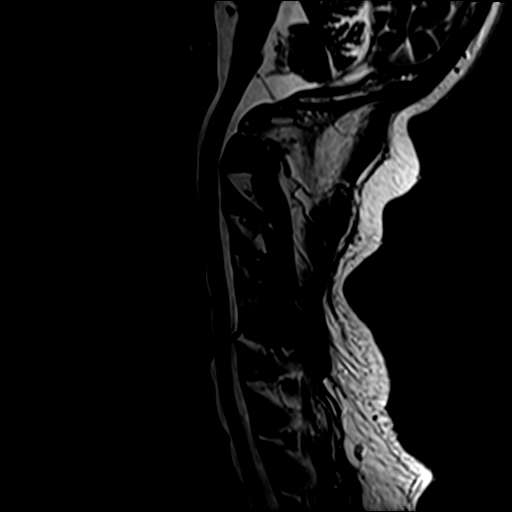
[im 12/17]
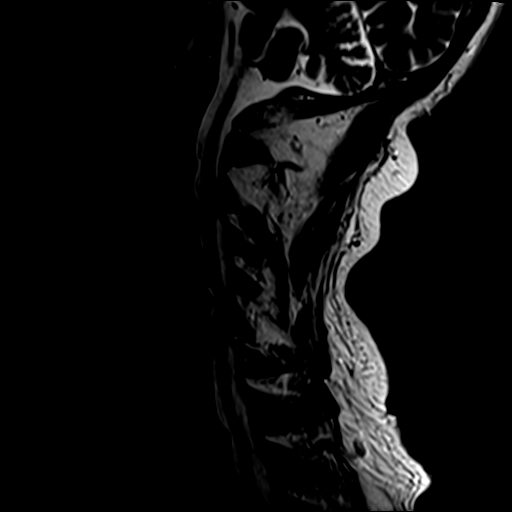
[im 14/17]
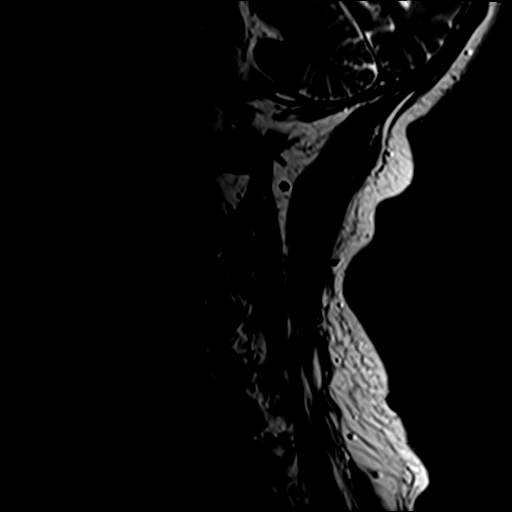
[im 17/17]
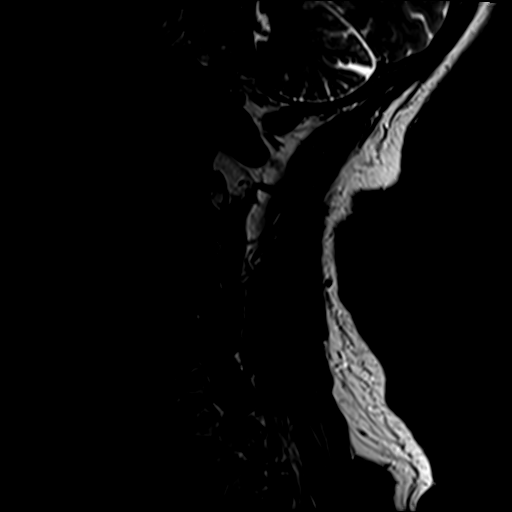

[Series 3: STIR · sagittal · 3.0mm · 0.82mm/px · 7 of 17 slices shown]
[im 1/17]
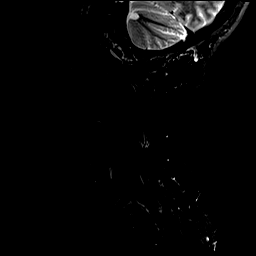
[im 3/17]
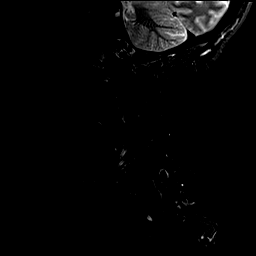
[im 6/17]
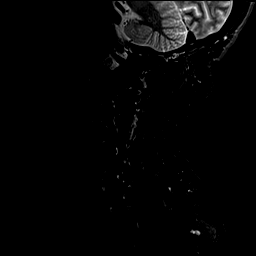
[im 9/17]
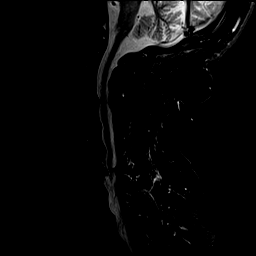
[im 11/17]
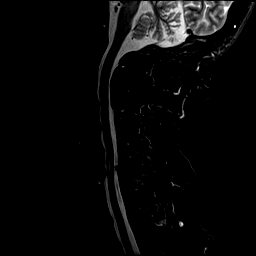
[im 14/17]
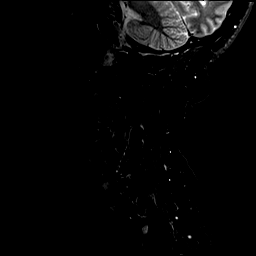
[im 17/17]
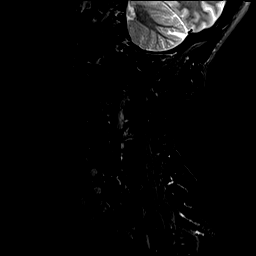

[Series 4: T1 · sagittal · 3.0mm · 0.82mm/px · 7 of 17 slices shown]
[im 1/17]
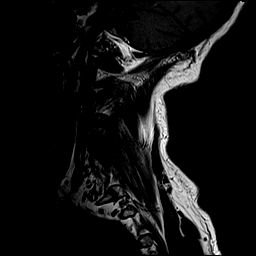
[im 3/17]
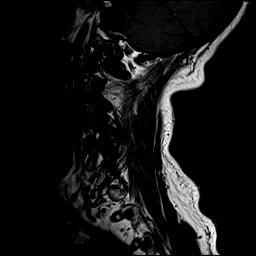
[im 6/17]
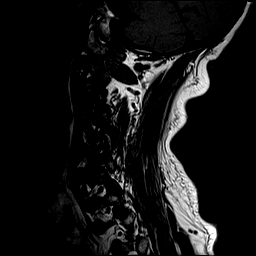
[im 9/17]
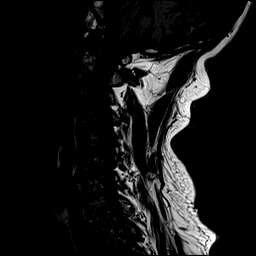
[im 11/17]
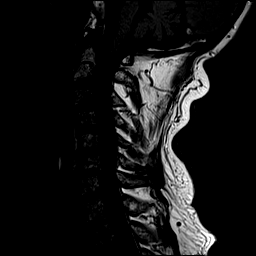
[im 14/17]
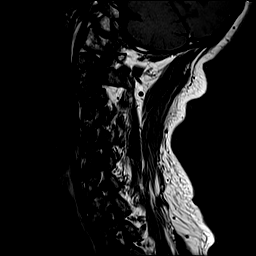
[im 17/17]
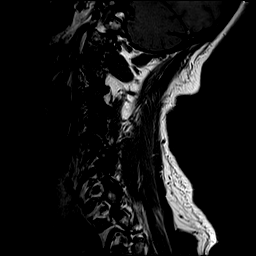

[Series 5: T2 · axial · 3.0mm · 0.94mm/px · z∈[-99,+7]mm · 12 of 30 slices shown (2 of 2)]
[im 1/30]
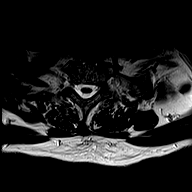
[im 3/30]
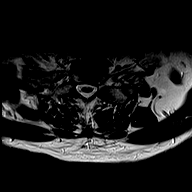
[im 5/30]
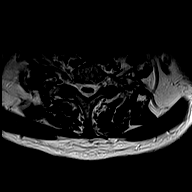
[im 8/30]
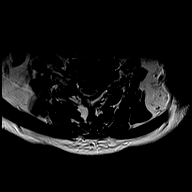
[im 10/30]
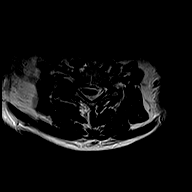
[im 13/30]
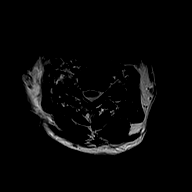
[im 15/30]
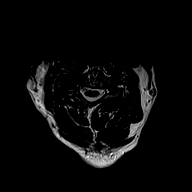
[im 17/30]
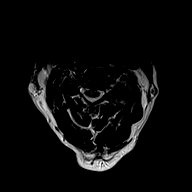
[im 20/30]
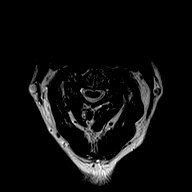
[im 22/30]
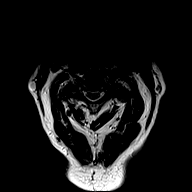
[im 25/30]
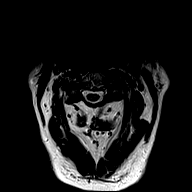
[im 30/30]
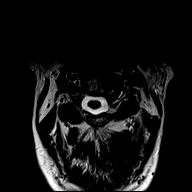

[Series 6: GRE · axial · 3.0mm · 0.47mm/px · z∈[-99,+7]mm · 8 of 30 slices shown]
[im 1/30]
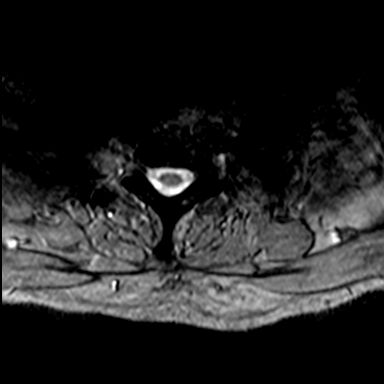
[im 5/30]
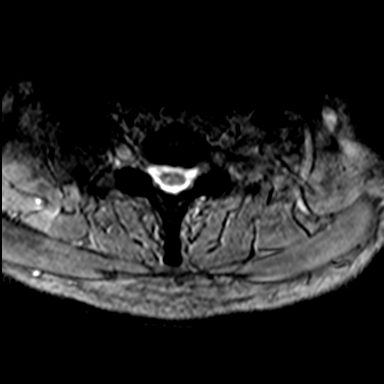
[im 10/30]
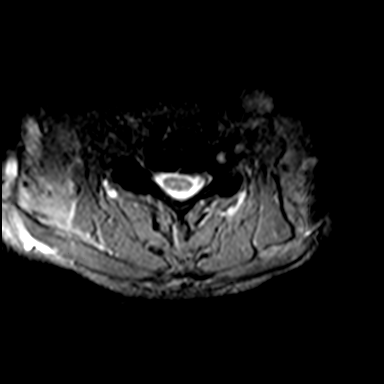
[im 13/30]
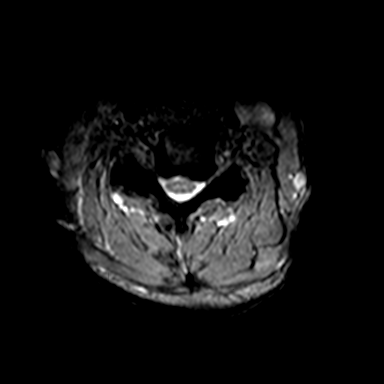
[im 17/30]
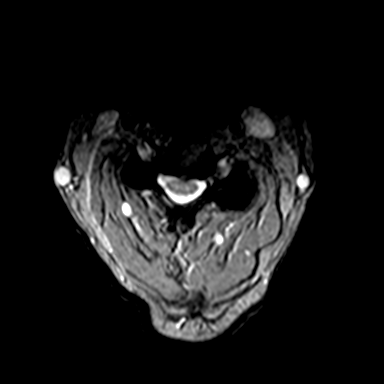
[im 20/30]
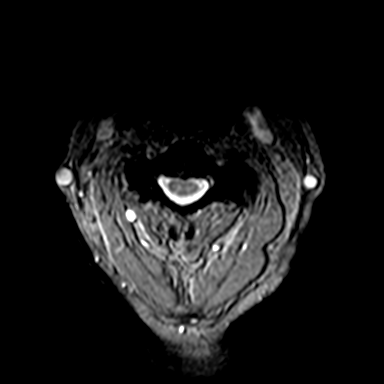
[im 25/30]
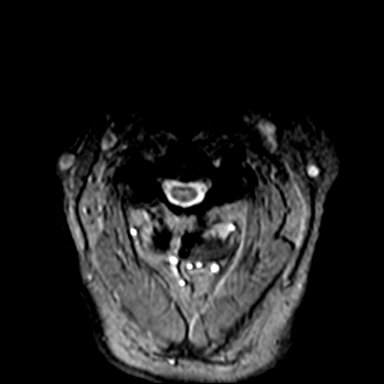
[im 30/30]
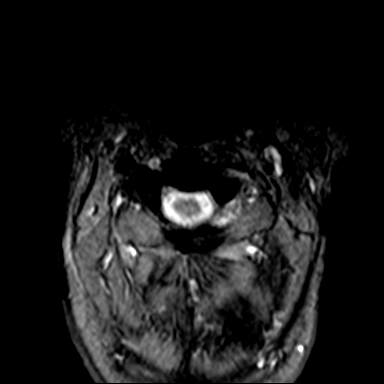

[42 of 48 positions shown; findings below may reference images not displayed]

FINDINGS: Alignment: Straightening/slight reversal of the normal cervical
lordosis. Grade 1 anterolisthesis of C3 on C4 and grade 1
retrolisthesis of C6 on C7.

Vertebrae: No fracture, suspicious marrow lesion, or significant
marrow edema.

Cord: Normal signal.

Posterior Fossa, vertebral arteries, paraspinal tissues:
Unremarkable.

Disc levels:

C2-3: Right uncovertebral spurring and asymmetrically severe right
facet arthrosis result in severe right neural foraminal stenosis
without spinal stenosis.

C3-4: Disc bulging, uncovertebral spurring, and severe facet
arthrosis result in mild spinal stenosis and severe bilateral neural
foraminal stenosis.

C4-5: Uncovertebral spurring and moderate right and severe left
facet arthrosis result in mild right and moderate left neural
foraminal stenosis. There is a small central disc protrusion without
significant spinal stenosis.

C5-6: Disc bulging, uncovertebral spurring, and asymmetrically
severe right facet arthrosis result in moderate bilateral neural
foraminal stenosis without spinal stenosis.

C6-7: Moderate disc space narrowing. Left eccentric disc bulging,
uncovertebral spurring, and mild facet arthrosis result in moderate
spinal stenosis with slight cord flattening and severe bilateral
neural foraminal stenosis.

C7-T1: Minimal disc bulging and mild facet arthrosis without
stenosis.
IMPRESSION: 1. Multilevel cervical disc degeneration, worst at C6-7 where there
is moderate spinal stenosis and severe bilateral neural foraminal
stenosis.
2. Mild spinal stenosis and severe bilateral neural foraminal
stenosis at C3-4.
3. Severe right foraminal stenosis at C2-3.
4. Moderate bilateral neural foraminal stenosis at C5-6.

## 2021-12-23 DIAGNOSIS — M47812 Spondylosis without myelopathy or radiculopathy, cervical region: Secondary | ICD-10-CM | POA: Diagnosis not present

## 2022-01-29 DIAGNOSIS — M47812 Spondylosis without myelopathy or radiculopathy, cervical region: Secondary | ICD-10-CM | POA: Diagnosis not present

## 2022-02-05 DIAGNOSIS — I10 Essential (primary) hypertension: Secondary | ICD-10-CM | POA: Diagnosis not present

## 2022-02-05 DIAGNOSIS — K219 Gastro-esophageal reflux disease without esophagitis: Secondary | ICD-10-CM | POA: Diagnosis not present

## 2022-02-05 DIAGNOSIS — N529 Male erectile dysfunction, unspecified: Secondary | ICD-10-CM | POA: Diagnosis not present

## 2022-02-05 DIAGNOSIS — M179 Osteoarthritis of knee, unspecified: Secondary | ICD-10-CM | POA: Diagnosis not present

## 2022-02-05 DIAGNOSIS — E7800 Pure hypercholesterolemia, unspecified: Secondary | ICD-10-CM | POA: Diagnosis not present

## 2022-02-05 DIAGNOSIS — F5101 Primary insomnia: Secondary | ICD-10-CM | POA: Diagnosis not present

## 2022-02-05 DIAGNOSIS — C61 Malignant neoplasm of prostate: Secondary | ICD-10-CM | POA: Diagnosis not present

## 2022-02-05 DIAGNOSIS — Z125 Encounter for screening for malignant neoplasm of prostate: Secondary | ICD-10-CM | POA: Diagnosis not present

## 2022-02-05 DIAGNOSIS — R7303 Prediabetes: Secondary | ICD-10-CM | POA: Diagnosis not present

## 2022-03-18 DIAGNOSIS — M1712 Unilateral primary osteoarthritis, left knee: Secondary | ICD-10-CM | POA: Diagnosis not present

## 2022-03-18 DIAGNOSIS — M1711 Unilateral primary osteoarthritis, right knee: Secondary | ICD-10-CM | POA: Diagnosis not present

## 2022-03-18 DIAGNOSIS — M170 Bilateral primary osteoarthritis of knee: Secondary | ICD-10-CM | POA: Diagnosis not present

## 2022-04-03 DIAGNOSIS — M47812 Spondylosis without myelopathy or radiculopathy, cervical region: Secondary | ICD-10-CM | POA: Diagnosis not present

## 2022-04-03 DIAGNOSIS — M47892 Other spondylosis, cervical region: Secondary | ICD-10-CM | POA: Diagnosis not present

## 2022-04-13 DIAGNOSIS — M170 Bilateral primary osteoarthritis of knee: Secondary | ICD-10-CM | POA: Diagnosis not present

## 2022-04-13 DIAGNOSIS — M25561 Pain in right knee: Secondary | ICD-10-CM | POA: Diagnosis not present

## 2022-04-13 DIAGNOSIS — M25562 Pain in left knee: Secondary | ICD-10-CM | POA: Diagnosis not present

## 2022-04-15 DIAGNOSIS — M5030 Other cervical disc degeneration, unspecified cervical region: Secondary | ICD-10-CM | POA: Diagnosis not present

## 2022-04-15 DIAGNOSIS — M47812 Spondylosis without myelopathy or radiculopathy, cervical region: Secondary | ICD-10-CM | POA: Diagnosis not present

## 2022-07-08 DIAGNOSIS — M170 Bilateral primary osteoarthritis of knee: Secondary | ICD-10-CM | POA: Diagnosis not present

## 2022-07-28 DIAGNOSIS — E7800 Pure hypercholesterolemia, unspecified: Secondary | ICD-10-CM | POA: Diagnosis not present

## 2022-07-28 DIAGNOSIS — I10 Essential (primary) hypertension: Secondary | ICD-10-CM | POA: Diagnosis not present

## 2022-07-28 DIAGNOSIS — C61 Malignant neoplasm of prostate: Secondary | ICD-10-CM | POA: Diagnosis not present

## 2022-07-28 DIAGNOSIS — M179 Osteoarthritis of knee, unspecified: Secondary | ICD-10-CM | POA: Diagnosis not present

## 2022-07-28 DIAGNOSIS — Z0000 Encounter for general adult medical examination without abnormal findings: Secondary | ICD-10-CM | POA: Diagnosis not present

## 2022-07-28 DIAGNOSIS — N529 Male erectile dysfunction, unspecified: Secondary | ICD-10-CM | POA: Diagnosis not present

## 2022-07-28 DIAGNOSIS — K219 Gastro-esophageal reflux disease without esophagitis: Secondary | ICD-10-CM | POA: Diagnosis not present

## 2022-07-28 DIAGNOSIS — F5101 Primary insomnia: Secondary | ICD-10-CM | POA: Diagnosis not present

## 2022-07-28 DIAGNOSIS — R7303 Prediabetes: Secondary | ICD-10-CM | POA: Diagnosis not present

## 2022-07-29 ENCOUNTER — Other Ambulatory Visit: Payer: Self-pay | Admitting: Family Medicine

## 2022-07-29 DIAGNOSIS — I10 Essential (primary) hypertension: Secondary | ICD-10-CM

## 2022-08-08 DIAGNOSIS — Z79899 Other long term (current) drug therapy: Secondary | ICD-10-CM | POA: Diagnosis not present

## 2022-08-08 DIAGNOSIS — K219 Gastro-esophageal reflux disease without esophagitis: Secondary | ICD-10-CM | POA: Diagnosis not present

## 2022-08-08 DIAGNOSIS — I10 Essential (primary) hypertension: Secondary | ICD-10-CM | POA: Diagnosis not present

## 2022-08-08 DIAGNOSIS — Z7982 Long term (current) use of aspirin: Secondary | ICD-10-CM | POA: Diagnosis not present

## 2022-08-08 DIAGNOSIS — Z87891 Personal history of nicotine dependence: Secondary | ICD-10-CM | POA: Diagnosis not present

## 2022-08-08 DIAGNOSIS — H9202 Otalgia, left ear: Secondary | ICD-10-CM | POA: Diagnosis not present

## 2022-08-08 DIAGNOSIS — E785 Hyperlipidemia, unspecified: Secondary | ICD-10-CM | POA: Diagnosis not present

## 2022-08-08 DIAGNOSIS — H9209 Otalgia, unspecified ear: Secondary | ICD-10-CM | POA: Diagnosis not present

## 2022-08-11 DIAGNOSIS — I10 Essential (primary) hypertension: Secondary | ICD-10-CM | POA: Diagnosis not present

## 2022-08-11 DIAGNOSIS — H524 Presbyopia: Secondary | ICD-10-CM | POA: Diagnosis not present

## 2022-08-11 DIAGNOSIS — E119 Type 2 diabetes mellitus without complications: Secondary | ICD-10-CM | POA: Diagnosis not present

## 2022-08-12 DIAGNOSIS — M25562 Pain in left knee: Secondary | ICD-10-CM | POA: Diagnosis not present

## 2022-08-12 DIAGNOSIS — M25662 Stiffness of left knee, not elsewhere classified: Secondary | ICD-10-CM | POA: Diagnosis not present

## 2022-08-19 DIAGNOSIS — H903 Sensorineural hearing loss, bilateral: Secondary | ICD-10-CM | POA: Diagnosis not present

## 2022-08-26 ENCOUNTER — Ambulatory Visit
Admission: RE | Admit: 2022-08-26 | Discharge: 2022-08-26 | Disposition: A | Discharge status: Home / Self Care | Payer: No Typology Code available for payment source | Source: Ambulatory Visit | Attending: Family Medicine | Admitting: Family Medicine

## 2022-08-26 DIAGNOSIS — I10 Essential (primary) hypertension: Secondary | ICD-10-CM

## 2022-09-10 DIAGNOSIS — M1712 Unilateral primary osteoarthritis, left knee: Secondary | ICD-10-CM | POA: Diagnosis not present

## 2022-09-10 DIAGNOSIS — G8918 Other acute postprocedural pain: Secondary | ICD-10-CM | POA: Diagnosis not present

## 2022-09-14 DIAGNOSIS — Z96652 Presence of left artificial knee joint: Secondary | ICD-10-CM | POA: Diagnosis not present

## 2022-09-14 DIAGNOSIS — M25562 Pain in left knee: Secondary | ICD-10-CM | POA: Diagnosis not present

## 2022-09-14 DIAGNOSIS — M6281 Muscle weakness (generalized): Secondary | ICD-10-CM | POA: Diagnosis not present

## 2022-09-16 DIAGNOSIS — H903 Sensorineural hearing loss, bilateral: Secondary | ICD-10-CM | POA: Diagnosis not present

## 2022-09-16 DIAGNOSIS — H9313 Tinnitus, bilateral: Secondary | ICD-10-CM | POA: Diagnosis not present

## 2022-09-17 DIAGNOSIS — Z96652 Presence of left artificial knee joint: Secondary | ICD-10-CM | POA: Diagnosis not present

## 2022-09-17 DIAGNOSIS — M6281 Muscle weakness (generalized): Secondary | ICD-10-CM | POA: Diagnosis not present

## 2022-09-17 DIAGNOSIS — M25562 Pain in left knee: Secondary | ICD-10-CM | POA: Diagnosis not present

## 2022-09-21 DIAGNOSIS — M6281 Muscle weakness (generalized): Secondary | ICD-10-CM | POA: Diagnosis not present

## 2022-09-21 DIAGNOSIS — M25562 Pain in left knee: Secondary | ICD-10-CM | POA: Diagnosis not present

## 2022-09-21 DIAGNOSIS — Z96652 Presence of left artificial knee joint: Secondary | ICD-10-CM | POA: Diagnosis not present

## 2022-09-24 DIAGNOSIS — M6281 Muscle weakness (generalized): Secondary | ICD-10-CM | POA: Diagnosis not present

## 2022-09-24 DIAGNOSIS — M25562 Pain in left knee: Secondary | ICD-10-CM | POA: Diagnosis not present

## 2022-09-24 DIAGNOSIS — Z96652 Presence of left artificial knee joint: Secondary | ICD-10-CM | POA: Diagnosis not present

## 2022-09-25 ENCOUNTER — Ambulatory Visit: Payer: Medicare HMO | Admitting: Cardiology

## 2022-09-25 ENCOUNTER — Encounter: Payer: Self-pay | Admitting: Cardiology

## 2022-09-25 VITALS — BP 126/84 | HR 70 | Resp 16 | Ht 72.0 in | Wt 193.0 lb

## 2022-09-25 DIAGNOSIS — R931 Abnormal findings on diagnostic imaging of heart and coronary circulation: Secondary | ICD-10-CM | POA: Diagnosis not present

## 2022-09-25 DIAGNOSIS — I10 Essential (primary) hypertension: Secondary | ICD-10-CM | POA: Diagnosis not present

## 2022-09-25 DIAGNOSIS — E782 Mixed hyperlipidemia: Secondary | ICD-10-CM | POA: Diagnosis not present

## 2022-09-25 NOTE — Progress Notes (Unsigned)
Patient referred by Johny Blamer, MD for elevated coronary calcium score  Subjective:   Shane Park., male    DOB: 01/04/1951, 72 y.o.   MRN: [id number]   Chief Complaint  Patient presents with   Elevated coronary calcium score    HPI  72 y.o. Caucasian male with hypertension, hyperlipidemia, hypertriglyceridemia, h/o viral myocarditis 2001, referred for elevated coronary calcium score.    Patient is retired, works in the yard Catering manager.  Recent physical activity is limited due to knee surgery, He has not.  He was found to have a coronary calcium score >500 on CT cardiac scoring scan.  He denies any chest pain, shortness of breath.  His father had MI at age 61.  Patient had viral myocarditis in 2001, with no recurrence.  He is currently doing simvastatin, but attributes t hence discharged to simvastatin.  Past Medical History:  Diagnosis Date   Arthritis    Cancer (HCC)    Colon polyps    Diverticulosis    GERD (gastroesophageal reflux disease)    History of hiatal hernia    Hypertension    Mitral valve insufficiency    mild   Myocarditis (HCC)    Pre-diabetes      Past Surgical History:  Procedure Laterality Date   ARTHROSCOPY KNEE W/ DRILLING Left    CARDIAC CATHETERIZATION     gynomastia     HERNIA REPAIR     LIPOMA EXCISION N/A 01/22/2017   Procedure: EXCISION OF SEBACEOUS CYST X 4 FROM BACK;  Surgeon: Berna Bue, MD;  Location: [facility];  Service: General;  Laterality: N/A;   TOTAL KNEE ARTHROPLASTY Left    TRANSURETHRAL RESECTION OF PROSTATE N/A 08/01/2021   Procedure: TRANSURETHRAL RESECTION OF THE PROSTATE (TURP);  Surgeon: Crist Fat, MD;  Location: [facility];  Service: Urology;  Laterality: N/A;     Social History   Tobacco Use  Smoking Status Former   Packs/day: 0.25   Years: 4.00   Additional pack years: 0.00   Total pack years: 1.00   Types: Cigarettes   Quit date: 2   Years since quitting: 51.3  Smokeless Tobacco Never     Social History   Substance and Sexual Activity  Alcohol Use Not Currently     Family History  Problem Relation Age of Onset   CAD Father      Current Outpatient Medications:    acetaminophen (TYLENOL) 650 MG CR tablet, Take 650 mg by mouth daily as needed for pain., Disp: , Rfl:    atenolol (TENORMIN) 100 MG tablet, Take 100 mg by mouth 2 (two) times daily., Disp: , Rfl: 3   CALCIUM CARB-MAGNESIUM CARB PO, Take 1 tablet by mouth every other day., Disp: , Rfl:    Cholecalciferol (VITAMIN D3) 50 MCG (2000 UT) TABS, Take 2,000 Units by mouth at bedtime., Disp: , Rfl:    co-enzyme Q-10 30 MG capsule, Take 30 mg by mouth 3 (three) times daily., Disp: , Rfl:    diclofenac Sodium (VOLTAREN) 1 % GEL, Apply 1 application  topically daily as needed (joint pain)., Disp: , Rfl:    fish oil-omega-3 fatty acids 1000 MG capsule, Take 1 g by mouth daily., Disp: , Rfl:    hydrochlorothiazide (HYDRODIURIL) 25 MG tablet, Take 25 mg by mouth every morning., Disp: , Rfl: 3   metFORMIN (GLUCOPHAGE) 1000 MG tablet, Take 1,000 mg by mouth 2 (two) times daily., Disp: , Rfl:    Multiple Vitamin (MULTIVITAMIN  WITH MINERALS) TABS tablet, Take 1 tablet by mouth every morning., Disp: , Rfl:    omeprazole (PRILOSEC) 20 MG capsule, Take 20 mg by mouth daily as needed (acid reflux)., Disp: , Rfl: 3   ramipril (ALTACE) 10 MG capsule, Take 10 mg by mouth every morning., Disp: , Rfl: 3   simvastatin (ZOCOR) 40 MG tablet, Take 40 mg by mouth at bedtime., Disp: , Rfl: 3   traZODone (DESYREL) 100 MG tablet, Take 100 mg by mouth at bedtime., Disp: , Rfl: 3   ZINC-VITAMIN C PO, Take 1 tablet by mouth daily as needed (immune system boost)., Disp: , Rfl:    Cardiovascular and other pertinent studies:  Reviewed external labs and tests, independently interpreted  EKG 09/25/2022: Sinus rhythm 68 bpm First degree A-V block   CT cardiac scoring 08/26/2022: Left Main: 0 LAD: 305 LCx: 206 RCA: 69.6   Total Agatston  Score: 581 MESA database percentile: 76   Recent labs: 07/28/2022: Glucose 91, BUN/Cr 25/1.23. EGFR 63. Na/K 139/4.5. Rest of the CMP normal H/H 14/44. MCV 82. Platelets 210 HbA1C 6.0% Chol 150, TG 253, HDL 39, LDL 71 TSH 1.2 normal   Review of Systems  Cardiovascular:  Negative for chest pain, dyspnea on exertion, leg swelling, palpitations and syncope.  Musculoskeletal:  Positive for joint pain.         Vitals:   09/25/22 1055  BP: 126/84  Pulse: 70  Resp: 16  SpO2: 95%     Body mass index is 26.18 kg/m. Filed Weights   09/25/22 1055  Weight: 193 lb (87.5 kg)     Objective:   Physical Exam Vitals and nursing note reviewed.  Constitutional:      General: He is not in acute distress. Neck:     Vascular: No JVD.  Cardiovascular:     Rate and Rhythm: Normal rate and regular rhythm.     Heart sounds: Normal heart sounds. No murmur heard. Pulmonary:     Effort: Pulmonary effort is normal.     Breath sounds: Normal breath sounds. No wheezing or rales.  Musculoskeletal:     Right lower leg: No edema.     Left lower leg: No edema.          Visit diagnoses:   ICD-10-CM   1. Elevated coronary artery calcium score  R93.1 EKG 12-Lead    Lipid panel    Lipid panel    2. Mixed hyperlipidemia  E78.2 Lipid panel    Lipid panel    3. Agatston coronary artery calcium score greater than 400  R93.1        Orders Placed This Encounter  Procedures   EKG 12-Lead     Assessment & Recommendations:    72 y.o. Caucasian male with hypertension, hyperlipidemia, hypertriglyceridemia, h/o viral myocarditis 2001, referred for elevated coronary calcium score.    Coronary calcium score >400: Score 581, 76th percentile. No angina symptoms at this time.Patient wants to hold off stress testing until he recovers frorm recent knee surgery. He is reluctant to change lipid lowering therapy at this time. Discussed heart healthy, low simple carbohydrate and low saturated  fat diet. Repeat fasting lipid panel in 6 months. Will reassess ischemia testing at that time.    Thank you for referring the patient to [place]. Please feel free to contact with any questions.   Elder Negus, MD Pager: [phone] Office: [phone]

## 2022-09-26 ENCOUNTER — Encounter: Payer: Self-pay | Admitting: Cardiology

## 2022-09-26 DIAGNOSIS — R931 Abnormal findings on diagnostic imaging of heart and coronary circulation: Secondary | ICD-10-CM | POA: Insufficient documentation

## 2022-09-26 DIAGNOSIS — E782 Mixed hyperlipidemia: Secondary | ICD-10-CM | POA: Insufficient documentation

## 2022-09-28 DIAGNOSIS — M6281 Muscle weakness (generalized): Secondary | ICD-10-CM | POA: Diagnosis not present

## 2022-09-28 DIAGNOSIS — Z96652 Presence of left artificial knee joint: Secondary | ICD-10-CM | POA: Diagnosis not present

## 2022-09-28 DIAGNOSIS — M25562 Pain in left knee: Secondary | ICD-10-CM | POA: Diagnosis not present

## 2022-10-01 DIAGNOSIS — M6281 Muscle weakness (generalized): Secondary | ICD-10-CM | POA: Diagnosis not present

## 2022-10-01 DIAGNOSIS — Z96652 Presence of left artificial knee joint: Secondary | ICD-10-CM | POA: Diagnosis not present

## 2022-10-01 DIAGNOSIS — M25562 Pain in left knee: Secondary | ICD-10-CM | POA: Diagnosis not present

## 2022-10-05 DIAGNOSIS — Z96652 Presence of left artificial knee joint: Secondary | ICD-10-CM | POA: Diagnosis not present

## 2022-10-05 DIAGNOSIS — M25562 Pain in left knee: Secondary | ICD-10-CM | POA: Diagnosis not present

## 2022-10-05 DIAGNOSIS — M6281 Muscle weakness (generalized): Secondary | ICD-10-CM | POA: Diagnosis not present

## 2022-10-08 DIAGNOSIS — M6281 Muscle weakness (generalized): Secondary | ICD-10-CM | POA: Diagnosis not present

## 2022-10-08 DIAGNOSIS — Z96652 Presence of left artificial knee joint: Secondary | ICD-10-CM | POA: Diagnosis not present

## 2022-10-08 DIAGNOSIS — M25562 Pain in left knee: Secondary | ICD-10-CM | POA: Diagnosis not present

## 2022-10-12 DIAGNOSIS — M6281 Muscle weakness (generalized): Secondary | ICD-10-CM | POA: Diagnosis not present

## 2022-10-12 DIAGNOSIS — M25562 Pain in left knee: Secondary | ICD-10-CM | POA: Diagnosis not present

## 2022-10-12 DIAGNOSIS — Z96652 Presence of left artificial knee joint: Secondary | ICD-10-CM | POA: Diagnosis not present

## 2022-10-15 DIAGNOSIS — M6281 Muscle weakness (generalized): Secondary | ICD-10-CM | POA: Diagnosis not present

## 2022-10-15 DIAGNOSIS — M25562 Pain in left knee: Secondary | ICD-10-CM | POA: Diagnosis not present

## 2022-10-15 DIAGNOSIS — Z96652 Presence of left artificial knee joint: Secondary | ICD-10-CM | POA: Diagnosis not present

## 2022-10-21 DIAGNOSIS — Z471 Aftercare following joint replacement surgery: Secondary | ICD-10-CM | POA: Diagnosis not present

## 2022-10-21 DIAGNOSIS — Z96652 Presence of left artificial knee joint: Secondary | ICD-10-CM | POA: Diagnosis not present

## 2022-11-19 DIAGNOSIS — C61 Malignant neoplasm of prostate: Secondary | ICD-10-CM | POA: Diagnosis not present

## 2022-11-26 DIAGNOSIS — C61 Malignant neoplasm of prostate: Secondary | ICD-10-CM | POA: Diagnosis not present

## 2022-11-26 DIAGNOSIS — R350 Frequency of micturition: Secondary | ICD-10-CM | POA: Diagnosis not present

## 2022-12-15 DIAGNOSIS — Z129 Encounter for screening for malignant neoplasm, site unspecified: Secondary | ICD-10-CM | POA: Diagnosis not present

## 2022-12-15 DIAGNOSIS — L821 Other seborrheic keratosis: Secondary | ICD-10-CM | POA: Diagnosis not present

## 2022-12-15 DIAGNOSIS — D1801 Hemangioma of skin and subcutaneous tissue: Secondary | ICD-10-CM | POA: Diagnosis not present

## 2022-12-15 DIAGNOSIS — L720 Epidermal cyst: Secondary | ICD-10-CM | POA: Diagnosis not present

## 2023-01-19 DIAGNOSIS — I10 Essential (primary) hypertension: Secondary | ICD-10-CM | POA: Diagnosis not present

## 2023-01-19 DIAGNOSIS — C61 Malignant neoplasm of prostate: Secondary | ICD-10-CM | POA: Diagnosis not present

## 2023-01-19 DIAGNOSIS — R5383 Other fatigue: Secondary | ICD-10-CM | POA: Diagnosis not present

## 2023-01-19 DIAGNOSIS — F5101 Primary insomnia: Secondary | ICD-10-CM | POA: Diagnosis not present

## 2023-01-19 DIAGNOSIS — N529 Male erectile dysfunction, unspecified: Secondary | ICD-10-CM | POA: Diagnosis not present

## 2023-01-19 DIAGNOSIS — K219 Gastro-esophageal reflux disease without esophagitis: Secondary | ICD-10-CM | POA: Diagnosis not present

## 2023-01-19 DIAGNOSIS — R7303 Prediabetes: Secondary | ICD-10-CM | POA: Diagnosis not present

## 2023-01-19 DIAGNOSIS — E7800 Pure hypercholesterolemia, unspecified: Secondary | ICD-10-CM | POA: Diagnosis not present

## 2023-02-08 ENCOUNTER — Encounter: Payer: Self-pay | Admitting: Cardiovascular Disease

## 2023-02-08 ENCOUNTER — Ambulatory Visit: Payer: Medicare HMO | Attending: Cardiovascular Disease | Admitting: Cardiovascular Disease

## 2023-02-08 VITALS — BP 114/74 | HR 71 | Ht 71.0 in | Wt 197.2 lb

## 2023-02-08 DIAGNOSIS — I10 Essential (primary) hypertension: Secondary | ICD-10-CM | POA: Diagnosis not present

## 2023-02-08 DIAGNOSIS — E782 Mixed hyperlipidemia: Secondary | ICD-10-CM | POA: Diagnosis not present

## 2023-02-08 DIAGNOSIS — R931 Abnormal findings on diagnostic imaging of heart and coronary circulation: Secondary | ICD-10-CM | POA: Diagnosis not present

## 2023-02-08 MED ORDER — EZETIMIBE 10 MG PO TABS: 10.0000 mg | ORAL_TABLET | Freq: Every day | ORAL | 3 refills | Status: DC

## 2023-02-08 MED ORDER — EZETIMIBE 10 MG PO TABS: 10.0000 mg | ORAL_TABLET | Freq: Every day | ORAL | 0 refills | Status: DC

## 2023-02-08 NOTE — Progress Notes (Signed)
Cardiology Office Note:    Date:  02/08/2023   ID:  Shane Crazier Shane Pellegrini., DOB Aug 29, 1950, MRN [id number]  PCP:  Noberto Retort, MD   [facility] Providers Cardiologist:  None     Referring MD: Noberto Retort, MD   No chief complaint on file. Shane Loehrer. is a 72 y.o. male who is being seen today for the evaluation of elevated coronary calcium score at the request of Noberto Retort, MD.   History of Present Illness:    Shane Sundet. is a 72 y.o. male with a hx of hypertension and hyperlipidemia, with a history of possible non-STEMI versus viral myocarditis in 2001 as well as a strong family history of premature onset CAD.  Recent calcium score was 581, 78th percentile for age/gender.  He is asymptomatic.  He is able to mow his own lawn and do hours of gardening without any complaints of shortness of breath or chest pain.  He denies palpitations, dizziness, syncope, lower extremity edema or claudication.  He has erectile dysfunction responsive to PDE 5 inhibitors.  He does complain of easy fatigue and wonders whether it could be from his beta-blocker.  He has a variety of joint complaints, particular prominent in his right knee (he has already had left total knee replacement.    When he was living in [facility] in 2001 he had an episode of chest pain that occurred roughly 2 or 3 days after an acute viral illness.  He was initially diagnosed as non-STEMI and underwent cardiac catheterization which showed, normal large coronary arteries".  Retrospectively other physicians hypothesized that this was actually an episode of acute viral myocarditis.  He has been taking statins for at least 25 years.  His most recent lipid profile showed an LDL cholesterol of 71, HDL 39, triglycerides 253.  He has gained a few pounds over the last year or 2 and this has been accompanied by worsening glucose intolerance.  Hemoglobin A1c was 6.3% a couple of weeks ago.  He quit  smoking more than 50 years ago and was never a heavy smoker.  His blood pressure has been well-controlled.  He had an echocardiogram in 2012 that showed "mild" mitral insufficiency but was otherwise reportedly normal.  Past Medical History:  Diagnosis Date   Arthritis    Cancer (HCC)    Colon polyps    Diverticulosis    GERD (gastroesophageal reflux disease)    History of hiatal hernia    Hypertension    Mitral valve insufficiency    mild   Myocarditis (HCC)    Pre-diabetes     Past Surgical History:  Procedure Laterality Date   ARTHROSCOPY KNEE W/ DRILLING Left    CARDIAC CATHETERIZATION     gynomastia     HERNIA REPAIR     LIPOMA EXCISION N/A 01/22/2017   Procedure: EXCISION OF SEBACEOUS CYST X 4 FROM BACK;  Surgeon: Berna Bue, MD;  Location: [facility];  Service: General;  Laterality: N/A;   TOTAL KNEE ARTHROPLASTY Left    TRANSURETHRAL RESECTION OF PROSTATE N/A 08/01/2021   Procedure: TRANSURETHRAL RESECTION OF THE PROSTATE (TURP);  Surgeon: Crist Fat, MD;  Location: [facility];  Service: Urology;  Laterality: N/A;    Current Medications: Current Meds  Medication Sig   acetaminophen (TYLENOL) 650 MG CR tablet Take 650 mg by mouth daily as needed for pain.   atenolol (TENORMIN) 100 MG tablet Take 100 mg by mouth daily.  Cholecalciferol (VITAMIN D3) 50 MCG (2000 UT) TABS Take 2,000 Units by mouth at bedtime.   Coenzyme Q10 (COQ10) 200 MG CAPS Take 200 mg by mouth 2 (two) times daily.   ezetimibe (ZETIA) 10 MG tablet Take 1 tablet (10 mg total) by mouth daily.   ezetimibe (ZETIA) 10 MG tablet Take 1 tablet (10 mg total) by mouth daily.   fish oil-omega-3 fatty acids 1000 MG capsule Take 1 g by mouth daily.   hydrochlorothiazide (HYDRODIURIL) 25 MG tablet Take 25 mg by mouth every morning.   Magnesium 250 MG TABS Take 250 mg by mouth daily at 6 (six) AM.   metFORMIN (GLUCOPHAGE) 1000 MG tablet Take 1,000 mg by mouth daily at 6 (six) AM.   Multiple Vitamin (MULTI  VITAMIN) TABS Take 1 tablet by mouth daily at 6 (six) AM.   nitroGLYCERIN (NITROSTAT) 0.4 MG SL tablet Place 0.4 mg under the tongue every 5 (five) minutes as needed for chest pain.   omeprazole (PRILOSEC) 20 MG capsule Take 20 mg by mouth daily as needed (acid reflux).   Potassium 99 MG TABS Take 99 mg by mouth daily at 6 (six) AM.   ramipril (ALTACE) 10 MG capsule Take 10 mg by mouth every morning.   simvastatin (ZOCOR) 40 MG tablet Take 40 mg by mouth at bedtime.   traZODone (DESYREL) 100 MG tablet Take 100 mg by mouth at bedtime.     Allergies:   Patient has no known allergies.   Social History   Socioeconomic History   Marital status: Married    Spouse name: Not on file   Number of children: Not on file   Years of education: Not on file   Highest education level: Not on file  Occupational History   Not on file  Tobacco Use   Smoking status: Former    Current packs/day: 0.00    Average packs/day: 0.3 packs/day for 4.0 years (1.0 ttl pk-yrs)    Types: Cigarettes    Start date: 39    Quit date: 82    Years since quitting: 51.7   Smokeless tobacco: Never  Vaping Use   Vaping status: Never Used  Substance and Sexual Activity   Alcohol use: Not Currently   Drug use: No   Sexual activity: Not on file  Other Topics Concern   Not on file  Social History Narrative   Not on file   Social Determinants of Health   Financial Resource Strain: Not on file  Food Insecurity: Not on file  Transportation Needs: Not on file  Physical Activity: Not on file  Stress: Not on file  Social Connections: Unknown (08/08/2022)   Received from [facility]   Social Network    Social Network: Not on file     Family History: The patient's family history includes CAD in his father.  His father had a myocardial infarction at age 67.  ROS:   Please see the history of present illness.     All other systems reviewed and are negative.  EKGs/Labs/Other Studies Reviewed:     The following studies were reviewed today: Coronary calcium score 08/27/2022 EKG Interpretation Date/Time:  Monday February 08 2023 13:51:43 EDT Ventricular Rate:  71 PR Interval:  250 QRS Duration:  102 QT Interval:  390 QTC Calculation: 423 R Axis:   -14  Text Interpretation: Sinus rhythm with 1st degree A-V block RSR' or QR pattern in V1 suggests right ventricular conduction delay Minimal voltage criteria for LVH,  may be normal variant ( R in aVL ) When compared with ECG of 21-Jul-2021 11:49, No significant change was found Confirmed by Vasili Fok [phone]) on 02/08/2023 2:07:20 PM    Recent Labs: No results found for requested labs within last 365 days.  Recent Lipid Panel No results found for: "CHOL", "TRIG", "HDL", "CHOLHDL", "VLDL", "LDLCALC", "LDLDIRECT" 07/28/2022 Cholesterol 150, HDL 39, LDL 71, triglycerides 253 Hemoglobin A1c 6.3% Hemoglobin 14.5, creatinine 1.26, potassium 5.1, ALT 21, TSH 1.69 (creatinine in 08/02/2021 was 1.01)  Risk Assessment/Calculations:                Physical Exam:    VS:  BP 114/74 (BP Location: Left Arm, Patient Position: Sitting, Cuff Size: Normal)   Pulse 71   Ht 5\' 11"  (1.803 m)   Wt 197 lb 3.2 oz (89.4 kg)   SpO2 95%   BMI 27.50 kg/m     Wt Readings from Last 3 Encounters:  02/08/23 197 lb 3.2 oz (89.4 kg)  09/25/22 193 lb (87.5 kg)  08/01/21 194 lb (88 kg)     GEN:  Well nourished, well developed in no acute distress HEENT: Normal NECK: No JVD; No carotid bruits LYMPHATICS: No lymphadenopathy CARDIAC: RRR, no murmurs, rubs, gallops RESPIRATORY:  Clear to auscultation without rales, wheezing or rhonchi  ABDOMEN: Soft, non-tender, non-distended MUSCULOSKELETAL:  No edema; No deformity  SKIN: Warm and dry NEUROLOGIC:  Alert and oriented x 3 PSYCHIATRIC:  Normal affect   ASSESSMENT:    1. Elevated coronary artery calcium score   2. Mixed hyperlipidemia   3. Essential hypertension    PLAN:    In order of  problems listed above:  CAD: Coronary calcium score greater than 400 places him in a category of risk equivalent to known CAD.  He is asymptomatic.  Will schedule him for a plain treadmill stress test.  If this is abnormal I would followed up with a coronary CT angiogram.  If it is normal to focus remains on risk factor mitigation.  His LDL cholesterol is very close to target, but he has additional aggravating findings including a low HDL and elevated triglycerides and prediabetes, all of which identify him as having insulin resistance and a highly atherogenic metabolic profile.  We discussed ways to improve his risk by lifestyle changes (reduce the intake of sugars and starches with high glycemic index, reduced intake of saturated fat, increase the intake of lean protein and unsaturated fats from fish, nuts, oils, avocado, etc.).  Reviewed in detail the concept of the "glycemic index" and the preference for whole food, less processed ingredients. HLP: Very likely to have high particle number of small dense LDL with high atherogenic potential.  Likely an inherited trait.  Calcium score is high even taking his conventional risk factors into account.  Recommend adding ezetimibe 10 mg daily, ideally achieving an LDL cholesterol less than 55. HTN: Very well-controlled maybe even excessively so (last month his blood pressure was 106/69, today 114/74) send he has complaints of fatigue.  Reduce the atenolol to 100 mg once daily.  Asked him to keep a log of his blood pressure.  If his blood pressure increases to more than 130/80 will probably choose to add a low-dose of amlodipine 2.5 mg daily instead.  He is already on hydrochlorothiazide and maximum dose ACE inhibitor.      Informed Consent   Shared Decision Making/Informed Consent The risks [chest pain, shortness of breath, cardiac arrhythmias, dizziness, blood pressure fluctuations, myocardial infarction, stroke/transient  ischemic attack, and life-threatening  complications (estimated to be 1 in 10,000)], benefits (risk stratification, diagnosing coronary artery disease, treatment guidance) and alternatives of an exercise tolerance test were discussed in detail with Shane Cruz and he agrees to proceed.       Medication Adjustments/Labs and Tests Ordered: Current medicines are reviewed at length with the patient today.  Concerns regarding medicines are outlined above.  Orders Placed This Encounter  Procedures   EXERCISE TOLERANCE TEST (ETT)   EKG 12-Lead   Meds ordered this encounter  Medications   ezetimibe (ZETIA) 10 MG tablet    Sig: Take 1 tablet (10 mg total) by mouth daily.    Dispense:  90 tablet    Refill:  3   ezetimibe (ZETIA) 10 MG tablet    Sig: Take 1 tablet (10 mg total) by mouth daily.    Dispense:  30 tablet    Refill:  0    Patient Instructions  Medication Instructions:  Zetia 10 mg Decrease Atenolol to 100 mg daily *If you need a refill on your cardiac medications before your next appointment, please call your pharmacy*  Log BP daily for a few weeks and send in by MyChart.   Testing/Procedures:                       Patient Instructions for Stress Test/Exercise Tolerance Test (ETT)  Medication instructions:   Do NOT take your Atenolol on the day of your procedure (within 12 hours of Test)  Do not eat, drink or use tobacco products four hours prior to the test.  Water is ok.  3.  Dress prepared to exercise in a comfortable, two piece clothing outfit and walking shoes.  4.  Bring any current prescription medications with you the day of the test.  5.  Notify the office 24 hours in advance if you cannot keep this appointment.  6.  If you have any questions, please call [phone].    Follow-Up: At [facility], you and your health needs are our priority.  As part of our continuing mission to provide you with exceptional heart care, we have created designated Provider Care Teams.  These Care Teams  include your primary Cardiologist (physician) and Advanced Practice Providers (APPs -  Physician Assistants and Nurse Practitioners) who all work together to provide you with the care you need, when you need it.  We recommend signing up for the patient portal called "MyChart".  Sign up information is provided on this After Visit Summary.  MyChart is used to connect with patients for Virtual Visits (Telemedicine).  Patients are able to view lab/test results, encounter notes, upcoming appointments, etc.  Non-urgent messages can be sent to your provider as well.   To learn more about what you can do with MyChart, go to [email or web address].    Your next appointment:    Follow up as needed   Provider:   Dr Royann Shivers     Signed, Thurmon Fair, MD  02/08/2023 2:59 PM    [facility]

## 2023-02-08 NOTE — Patient Instructions (Addendum)
Medication Instructions:  Zetia 10 mg Decrease Atenolol to 100 mg daily *If you need a refill on your cardiac medications before your next appointment, please call your pharmacy*  Log BP daily for a few weeks and send in by MyChart.   Testing/Procedures:                       Patient Instructions for Stress Test/Exercise Tolerance Test (ETT)  Medication instructions:   Do NOT take your Atenolol on the day of your procedure (within 12 hours of Test)  Do not eat, drink or use tobacco products four hours prior to the test.  Water is ok.  3.  Dress prepared to exercise in a comfortable, two piece clothing outfit and walking shoes.  4.  Bring any current prescription medications with you the day of the test.  5.  Notify the office 24 hours in advance if you cannot keep this appointment.  6.  If you have any questions, please call [phone].    Follow-Up: At [facility], you and your health needs are our priority.  As part of our continuing mission to provide you with exceptional heart care, we have created designated Provider Care Teams.  These Care Teams include your primary Cardiologist (physician) and Advanced Practice Providers (APPs -  Physician Assistants and Nurse Practitioners) who all work together to provide you with the care you need, when you need it.  We recommend signing up for the patient portal called "MyChart".  Sign up information is provided on this After Visit Summary.  MyChart is used to connect with patients for Virtual Visits (Telemedicine).  Patients are able to view lab/test results, encounter notes, upcoming appointments, etc.  Non-urgent messages can be sent to your provider as well.   To learn more about what you can do with MyChart, go to [email or web address].    Your next appointment:    Follow up as needed   Provider:   Dr Royann Shivers

## 2023-02-11 ENCOUNTER — Telehealth (HOSPITAL_COMMUNITY): Payer: Self-pay | Admitting: *Deleted

## 2023-02-11 NOTE — Telephone Encounter (Signed)
Spoke with patient and gave instructions about his ETT scheduled for 02/12/23  at 3:30. I also reminded him not to take his Atenolol.

## 2023-02-12 ENCOUNTER — Ambulatory Visit (HOSPITAL_COMMUNITY): Payer: Medicare HMO | Attending: Cardiovascular Disease

## 2023-02-12 DIAGNOSIS — R931 Abnormal findings on diagnostic imaging of heart and coronary circulation: Secondary | ICD-10-CM | POA: Insufficient documentation

## 2023-02-12 LAB — EXERCISE TOLERANCE TEST
Angina Index: 0
Duke Treadmill Score: 7
Estimated workload: 7.7
Exercise duration (min): 6 min
Exercise duration (sec): 30 s
MPHR: 150 {beats}/min
Peak HR: 133 {beats}/min
Percent HR: 88 %
Rest HR: 82 {beats}/min
ST Depression (mm): 0 mm

## 2023-03-04 ENCOUNTER — Other Ambulatory Visit: Payer: Self-pay | Admitting: Cardiovascular Disease

## 2023-03-25 ENCOUNTER — Ambulatory Visit: Payer: Medicare HMO | Admitting: Cardiology

## 2023-04-12 DIAGNOSIS — M1711 Unilateral primary osteoarthritis, right knee: Secondary | ICD-10-CM | POA: Diagnosis not present

## 2023-04-12 DIAGNOSIS — M25561 Pain in right knee: Secondary | ICD-10-CM | POA: Diagnosis not present

## 2023-04-16 ENCOUNTER — Telehealth: Payer: Self-pay

## 2023-04-16 NOTE — Telephone Encounter (Signed)
   Pre-operative Risk Assessment    Patient Name: Shane Cruz.  DOB: 1951/05/09 MRN: [id number]      Request for Surgical Clearance    Procedure:   RIGHT TOTAL KNEE ARTHROPLASTY  Date of Surgery:  Clearance 06/21/23                                 Surgeon:  DR. Raylene Everts Surgeon's Group or Practice Name:  [facility] Phone number:  [phone] Fax number:  [phone]   Type of Clearance Requested:   - Medical    Type of Anesthesia:  Spinal   Additional requests/questions:    SignedMichaelle Copas   04/16/2023, 4:46 PM

## 2023-04-20 NOTE — Telephone Encounter (Signed)
   Name: Bienvenido Novosel Felicity Pellegrini.  DOB: [birth year]  MRN: [id number]  Primary Cardiologist: None   Preoperative team, please contact this patient and set up a phone call appointment for further preoperative risk assessment. Please obtain consent and complete medication review. Thank you for your help.  I confirm that guidance regarding antiplatelet and oral anticoagulation therapy has been completed and, if necessary, noted below.  No medications to hold.  I also confirmed the patient resides in the state of [place]. As per [facility] [employer] telemedicine laws, the patient must reside in the state in which the provider is licensed.   Napoleon Form, Leodis Rains, NP 04/20/2023, 1:49 PM [facility]

## 2023-04-20 NOTE — Telephone Encounter (Signed)
PRIMARY CARDIOLOGIST IS DR. PATWARDHAN.  Tried to reach pt to set up tele appt, though no answer.

## 2023-04-21 ENCOUNTER — Telehealth: Payer: Self-pay | Admitting: *Deleted

## 2023-04-21 NOTE — Telephone Encounter (Signed)
Pt has been scheduled tele pre op appt 06/07/23. Med rec and consent are done.   Pt states his primary cardiologist is Dr. Thayer Ohm, not Dr. Rosemary Holms. I assured the pt that I will update the chart.

## 2023-04-21 NOTE — Telephone Encounter (Signed)
Pt has been scheduled tele pre op appt 06/07/23. Med rec and consent are done.   Pt states his primary cardiologist is Dr. Thayer Ohm, not Dr. Rosemary Holms. I assured the pt that I will update the chart.      Patient Consent for Virtual Visit        Shane Cruz. has provided verbal consent on 04/21/2023 for a virtual visit (video or telephone).   CONSENT FOR VIRTUAL VISIT FOR:  Shane Cruz.  By participating in this virtual visit I agree to the following:  I hereby voluntarily request, consent and authorize [facility] and its employed or contracted physicians, physician assistants, nurse practitioners or other licensed health care professionals (the Practitioner), to provide me with telemedicine health care services (the "Services") as deemed necessary by the treating Practitioner. I acknowledge and consent to receive the Services by the Practitioner via telemedicine. I understand that the telemedicine visit will involve communicating with the Practitioner through live audiovisual communication technology and the disclosure of certain medical information by electronic transmission. I acknowledge that I have been given the opportunity to request an in-person assessment or other available alternative prior to the telemedicine visit and am voluntarily participating in the telemedicine visit.  I understand that I have the right to withhold or withdraw my consent to the use of telemedicine in the course of my care at any time, without affecting my right to future care or treatment, and that the Practitioner or I may terminate the telemedicine visit at any time. I understand that I have the right to inspect all information obtained and/or recorded in the course of the telemedicine visit and may receive copies of available information for a reasonable fee.  I understand that some of the potential risks of receiving the Services via telemedicine include:  Delay or interruption in  medical evaluation due to technological equipment failure or disruption; Information transmitted may not be sufficient (e.g. poor resolution of images) to allow for appropriate medical decision making by the Practitioner; and/or  In rare instances, security protocols could fail, causing a breach of personal health information.  Furthermore, I acknowledge that it is my responsibility to provide information about my medical history, conditions and care that is complete and accurate to the best of my ability. I acknowledge that Practitioner's advice, recommendations, and/or decision may be based on factors not within their control, such as incomplete or inaccurate data provided by me or distortions of diagnostic images or specimens that may result from electronic transmissions. I understand that the practice of medicine is not an exact science and that Practitioner makes no warranties or guarantees regarding treatment outcomes. I acknowledge that a copy of this consent can be made available to me via my patient portal [facility] MyChart), or I can request a printed copy by calling the office of [facility].    I understand that my insurance will be billed for this visit.   I have read or had this consent read to me. I understand the contents of this consent, which adequately explains the benefits and risks of the Services being provided via telemedicine.  I have been provided ample opportunity to ask questions regarding this consent and the Services and have had my questions answered to my satisfaction. I give my informed consent for the services to be provided through the use of telemedicine in my medical care

## 2023-05-05 NOTE — Telephone Encounter (Signed)
Requesting office faxed new request just updating the date of surgery is now 06/14/23 moved up from originally 06/21/23. Pt has tele pre op appt 06/07/23, there are no meds to hold. Per preop APP ok to keep the 06/07/23 tele appt as we are booked up and no sooner appts open.   I will update the surgeon's office.

## 2023-05-31 DIAGNOSIS — Z79899 Other long term (current) drug therapy: Secondary | ICD-10-CM | POA: Diagnosis not present

## 2023-06-07 ENCOUNTER — Ambulatory Visit: Payer: Medicare HMO | Attending: Nurse Practitioner | Admitting: Nurse Practitioner

## 2023-06-07 ENCOUNTER — Encounter: Payer: Self-pay | Admitting: Nurse Practitioner

## 2023-06-07 DIAGNOSIS — Z01810 Encounter for preprocedural cardiovascular examination: Secondary | ICD-10-CM

## 2023-06-07 NOTE — Progress Notes (Signed)
 Virtual Visit via Telephone Note   Because of Shane Bonn Goertzen Jr.'s co-morbid illnesses, he is at least at moderate risk for complications without adequate follow up.  This format is felt to be most appropriate for this patient at this time.  The patient did not have access to video technology/had technical difficulties with video requiring transitioning to audio format only (telephone).  All issues noted in this document were discussed and addressed.  No physical exam could be performed with this format.  Please refer to the patient's chart for his consent to telehealth for [facility].  Evaluation Performed:  Preoperative cardiovascular risk assessment _____________   Date:  06/07/2023   Patient ID:  Shane ORN Koreen Mickey., DOB Jan 06, 1951, MRN [id number] Patient Location:  Home Provider location:   Office  Primary Care Provider:  Arloa Elsie SAUNDERS, MD Primary Cardiologist:  Jerel Balding, MD  Chief Complaint / Patient Profile   73 y.o. y/o male with a h/o hypertension, hyperlipidemia, family history premature CAD, elevated CAC score, possible NSTEMI vs viral myocarditis in 2001 who is pending right total knee arthroplasty and presents today for telephonic preoperative cardiovascular risk assessment.  History of Present Illness    Shane Monie. is a 73 y.o. male who presents via audio/video conferencing for a telehealth visit today.  Pt was last seen in cardiology clinic on 02/08/23 by Dr. Balding.  At that time Shane Marciano. was doing well.  The patient is now pending procedure as outlined above. Since his last visit, he  denies chest pain, shortness of breath, lower extremity edema, fatigue, palpitations, melena, hematuria, hemoptysis, diaphoresis, weakness, presyncope, syncope, orthopnea, and PND. He is able to achieve > 4 METS activity with gardening and yard work and is not having any concerning cardiac symptoms.   Past Medical History    Past Medical  History:  Diagnosis Date   Arthritis    Cancer (HCC)    Colon polyps    Diverticulosis    GERD (gastroesophageal reflux disease)    History of hiatal hernia    Hypertension    Mitral valve insufficiency    mild   Myocarditis (HCC)    Pre-diabetes    Past Surgical History:  Procedure Laterality Date   ARTHROSCOPY KNEE W/ DRILLING Left    CARDIAC CATHETERIZATION     gynomastia     HERNIA REPAIR     LIPOMA EXCISION N/A 01/22/2017   Procedure: EXCISION OF SEBACEOUS CYST X 4 FROM BACK;  Surgeon: Signe Mitzie LABOR, MD;  Location: [facility] OR;  Service: General;  Laterality: N/A;   TOTAL KNEE ARTHROPLASTY Left    TRANSURETHRAL RESECTION OF PROSTATE N/A 08/01/2021   Procedure: TRANSURETHRAL RESECTION OF THE PROSTATE (TURP);  Surgeon: Cam Morene ORN, MD;  Location: [facility] ORS;  Service: Urology;  Laterality: N/A;    Allergies  No Known Allergies  Home Medications    Prior to Admission medications   Medication Sig Start Date End Date Taking? Authorizing Provider  acetaminophen  (TYLENOL ) 650 MG CR tablet Take 650 mg by mouth daily as needed for pain.    [provider]  amoxicillin (AMOXIL) 500 MG capsule Take 500 mg by mouth. Take 4 capsules by mouth 1 hour prior to dental work Patient not taking: Reported on 02/08/2023 11/26/22   [provider]  atenolol  (TENORMIN ) 100 MG tablet Take 100 mg by mouth daily. 11/15/16   [provider]  Cholecalciferol (VITAMIN D3) 50 MCG (2000 UT) TABS Take  2,000 Units by mouth at bedtime.    [provider]  Coenzyme Q10 (COQ10) 200 MG CAPS Take 200 mg by mouth 2 (two) times daily. 01/14/23   [provider]  diclofenac Sodium (VOLTAREN) 1 % GEL Apply 1 application  topically daily as needed (joint pain).    [provider]  ezetimibe  (ZETIA ) 10 MG tablet Take 1 tablet (10 mg total) by mouth daily. 02/08/23   Croitoru, Mihai, MD  ezetimibe  (ZETIA ) 10 MG tablet TAKE 1 TABLET BY MOUTH EVERY DAY 03/04/23    Croitoru, Mihai, MD  fish oil-omega-3 fatty acids 1000 MG capsule Take 1 g by mouth daily.    [provider]  hydrochlorothiazide  (HYDRODIURIL ) 25 MG tablet Take 25 mg by mouth every morning. 11/16/16   [provider]  Magnesium 250 MG TABS Take 250 mg by mouth daily at 6 (six) AM. 01/14/23   [provider]  meloxicam (MOBIC) 15 MG tablet Take 15 mg by mouth as needed for pain. Patient not taking: Reported on 02/08/2023 05/10/20   [provider]  metFORMIN (GLUCOPHAGE) 1000 MG tablet Take 1,000 mg by mouth daily at 6 (six) AM.    [provider]  methocarbamol (ROBAXIN) 500 MG tablet Take 500 mg by mouth every 6 (six) hours as needed. Patient not taking: Reported on 02/08/2023 09/18/22   [provider]  Multiple Vitamin (MULTI VITAMIN) TABS Take 1 tablet by mouth daily at 6 (six) AM.    [provider]  nitroGLYCERIN (NITROSTAT) 0.4 MG SL tablet Place 0.4 mg under the tongue every 5 (five) minutes as needed for chest pain.    [provider]  omeprazole (PRILOSEC) 20 MG capsule Take 20 mg by mouth daily as needed (acid reflux). 11/16/16   [provider]  Potassium 99 MG TABS Take 99 mg by mouth daily at 6 (six) AM. 01/14/23   [provider]  ramipril  (ALTACE ) 10 MG capsule Take 10 mg by mouth every morning. 11/16/16   [provider]  sildenafil (VIAGRA) 100 MG tablet Take 100 mg by mouth as needed. 07/15/21   [provider]  simvastatin (ZOCOR) 40 MG tablet Take 40 mg by mouth at bedtime. 11/16/16   [provider]  traZODone  (DESYREL ) 100 MG tablet Take 100 mg by mouth at bedtime. 11/15/16   [provider]    Physical Exam    Vital Signs:  Shane LELON Koreen Mickey. does not have vital signs available for review today.  Given telephonic nature of communication, physical exam is limited. AAOx3. NAD. Normal affect.  Speech and respirations are unlabored.  Accessory Clinical  Findings    None  Assessment & Plan    1.  Preoperative Cardiovascular Risk Assessment:According to the Revised Cardiac Risk Index (RCRI), his Perioperative Risk of Major Cardiac Event is (%): 0.9. His Functional Capacity in METs is: 8.23 according to the Duke Activity Status Index (DASI). The patient is doing well from a cardiac perspective. Therefore, based on ACC/AHA guidelines, the patient would be at acceptable risk for the planned procedure without further cardiovascular testing.   The patient was advised that if he develops new symptoms prior to surgery to contact our office to arrange for a follow-up visit, and he verbalized understanding.  No request to hold cardiac medications.   (A copy of this note will be routed to requesting surgeon.  Time:   Today, I have spent 10 minutes with the patient with telehealth technology discussing medical history,  symptoms, and management plan.    Rosaline EMERSON Bane, NP-C  06/07/2023, 2:07 PM [address], [address] Office [phone] Fax [phone]

## 2023-06-14 DIAGNOSIS — M25761 Osteophyte, right knee: Secondary | ICD-10-CM | POA: Diagnosis not present

## 2023-06-14 DIAGNOSIS — G8918 Other acute postprocedural pain: Secondary | ICD-10-CM | POA: Diagnosis not present

## 2023-06-14 DIAGNOSIS — M1711 Unilateral primary osteoarthritis, right knee: Secondary | ICD-10-CM | POA: Diagnosis not present

## 2023-06-16 DIAGNOSIS — M25561 Pain in right knee: Secondary | ICD-10-CM | POA: Diagnosis not present

## 2023-06-16 DIAGNOSIS — Z96651 Presence of right artificial knee joint: Secondary | ICD-10-CM | POA: Diagnosis not present

## 2023-06-18 DIAGNOSIS — Z96651 Presence of right artificial knee joint: Secondary | ICD-10-CM | POA: Diagnosis not present

## 2023-06-18 DIAGNOSIS — M25561 Pain in right knee: Secondary | ICD-10-CM | POA: Diagnosis not present

## 2023-06-22 DIAGNOSIS — Z96651 Presence of right artificial knee joint: Secondary | ICD-10-CM | POA: Diagnosis not present

## 2023-06-22 DIAGNOSIS — M25561 Pain in right knee: Secondary | ICD-10-CM | POA: Diagnosis not present

## 2023-06-24 DIAGNOSIS — Z96651 Presence of right artificial knee joint: Secondary | ICD-10-CM | POA: Diagnosis not present

## 2023-06-24 DIAGNOSIS — M25561 Pain in right knee: Secondary | ICD-10-CM | POA: Diagnosis not present

## 2023-06-28 DIAGNOSIS — Z96651 Presence of right artificial knee joint: Secondary | ICD-10-CM | POA: Diagnosis not present

## 2023-06-28 DIAGNOSIS — M25561 Pain in right knee: Secondary | ICD-10-CM | POA: Diagnosis not present

## 2023-07-01 DIAGNOSIS — Z96651 Presence of right artificial knee joint: Secondary | ICD-10-CM | POA: Diagnosis not present

## 2023-07-01 DIAGNOSIS — M25561 Pain in right knee: Secondary | ICD-10-CM | POA: Diagnosis not present

## 2023-07-06 DIAGNOSIS — M25561 Pain in right knee: Secondary | ICD-10-CM | POA: Diagnosis not present

## 2023-07-06 DIAGNOSIS — Z96651 Presence of right artificial knee joint: Secondary | ICD-10-CM | POA: Diagnosis not present

## 2023-07-08 DIAGNOSIS — M25561 Pain in right knee: Secondary | ICD-10-CM | POA: Diagnosis not present

## 2023-07-08 DIAGNOSIS — Z96651 Presence of right artificial knee joint: Secondary | ICD-10-CM | POA: Diagnosis not present

## 2023-07-13 DIAGNOSIS — Z96651 Presence of right artificial knee joint: Secondary | ICD-10-CM | POA: Diagnosis not present

## 2023-07-13 DIAGNOSIS — M25561 Pain in right knee: Secondary | ICD-10-CM | POA: Diagnosis not present

## 2023-07-16 DIAGNOSIS — M25561 Pain in right knee: Secondary | ICD-10-CM | POA: Diagnosis not present

## 2023-07-16 DIAGNOSIS — Z96651 Presence of right artificial knee joint: Secondary | ICD-10-CM | POA: Diagnosis not present

## 2023-07-20 DIAGNOSIS — M25561 Pain in right knee: Secondary | ICD-10-CM | POA: Diagnosis not present

## 2023-07-20 DIAGNOSIS — Z96651 Presence of right artificial knee joint: Secondary | ICD-10-CM | POA: Diagnosis not present

## 2023-07-26 DIAGNOSIS — M25561 Pain in right knee: Secondary | ICD-10-CM | POA: Diagnosis not present

## 2023-07-26 DIAGNOSIS — Z96651 Presence of right artificial knee joint: Secondary | ICD-10-CM | POA: Diagnosis not present

## 2023-07-28 DIAGNOSIS — Z96651 Presence of right artificial knee joint: Secondary | ICD-10-CM | POA: Diagnosis not present

## 2023-07-28 DIAGNOSIS — M25561 Pain in right knee: Secondary | ICD-10-CM | POA: Diagnosis not present

## 2023-07-28 DIAGNOSIS — Z471 Aftercare following joint replacement surgery: Secondary | ICD-10-CM | POA: Diagnosis not present

## 2023-08-02 DIAGNOSIS — E7800 Pure hypercholesterolemia, unspecified: Secondary | ICD-10-CM | POA: Diagnosis not present

## 2023-08-02 DIAGNOSIS — Z0000 Encounter for general adult medical examination without abnormal findings: Secondary | ICD-10-CM | POA: Diagnosis not present

## 2023-08-02 DIAGNOSIS — K219 Gastro-esophageal reflux disease without esophagitis: Secondary | ICD-10-CM | POA: Diagnosis not present

## 2023-08-02 DIAGNOSIS — F5101 Primary insomnia: Secondary | ICD-10-CM | POA: Diagnosis not present

## 2023-08-02 DIAGNOSIS — C61 Malignant neoplasm of prostate: Secondary | ICD-10-CM | POA: Diagnosis not present

## 2023-08-02 DIAGNOSIS — I10 Essential (primary) hypertension: Secondary | ICD-10-CM | POA: Diagnosis not present

## 2023-08-02 DIAGNOSIS — I2510 Atherosclerotic heart disease of native coronary artery without angina pectoris: Secondary | ICD-10-CM | POA: Diagnosis not present

## 2023-08-02 DIAGNOSIS — R7303 Prediabetes: Secondary | ICD-10-CM | POA: Diagnosis not present

## 2023-08-02 DIAGNOSIS — Z1211 Encounter for screening for malignant neoplasm of colon: Secondary | ICD-10-CM | POA: Diagnosis not present

## 2023-10-06 ENCOUNTER — Telehealth (INDEPENDENT_AMBULATORY_CARE_PROVIDER_SITE_OTHER): Payer: Self-pay | Admitting: Otolaryngology

## 2023-10-06 NOTE — Telephone Encounter (Signed)
 Confirmed appt location with patient for 10/07/2023.

## 2023-10-07 ENCOUNTER — Ambulatory Visit (INDEPENDENT_AMBULATORY_CARE_PROVIDER_SITE_OTHER): Payer: Medicare HMO | Admitting: Otolaryngology

## 2023-10-07 ENCOUNTER — Encounter (INDEPENDENT_AMBULATORY_CARE_PROVIDER_SITE_OTHER): Payer: Self-pay | Admitting: Otolaryngology

## 2023-10-07 VITALS — BP 138/86 | HR 72 | Ht 71.0 in | Wt 195.0 lb

## 2023-10-07 DIAGNOSIS — H903 Sensorineural hearing loss, bilateral: Secondary | ICD-10-CM

## 2023-10-07 DIAGNOSIS — H6123 Impacted cerumen, bilateral: Secondary | ICD-10-CM
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
## 2023-10-09 DIAGNOSIS — H6123 Impacted cerumen, bilateral: Secondary | ICD-10-CM | POA: Insufficient documentation

## 2023-10-09 DIAGNOSIS — H903 Sensorineural hearing loss, bilateral: Secondary | ICD-10-CM | POA: Insufficient documentation

## 2023-10-11 DIAGNOSIS — H2513 Age-related nuclear cataract, bilateral: Secondary | ICD-10-CM | POA: Diagnosis not present

## 2023-10-11 DIAGNOSIS — Z135 Encounter for screening for eye and ear disorders: Secondary | ICD-10-CM | POA: Diagnosis not present

## 2023-10-11 DIAGNOSIS — H524 Presbyopia: Secondary | ICD-10-CM | POA: Diagnosis not present

## 2023-10-22 DIAGNOSIS — H524 Presbyopia: Secondary | ICD-10-CM | POA: Diagnosis not present

## 2023-11-12 DIAGNOSIS — C61 Malignant neoplasm of prostate: Secondary | ICD-10-CM | POA: Diagnosis not present

## 2023-11-19 DIAGNOSIS — C61 Malignant neoplasm of prostate: Secondary | ICD-10-CM | POA: Diagnosis not present

## 2023-11-19 DIAGNOSIS — R350 Frequency of micturition: Secondary | ICD-10-CM | POA: Diagnosis not present

## 2023-11-30 ENCOUNTER — Other Ambulatory Visit: Payer: Self-pay | Admitting: Cardiovascular Disease

## 2023-12-16 DIAGNOSIS — Z129 Encounter for screening for malignant neoplasm, site unspecified: Secondary | ICD-10-CM | POA: Diagnosis not present

## 2023-12-16 DIAGNOSIS — M71342 Other bursal cyst, left hand: Secondary | ICD-10-CM | POA: Diagnosis not present

## 2023-12-16 DIAGNOSIS — L821 Other seborrheic keratosis: Secondary | ICD-10-CM | POA: Diagnosis not present

## 2023-12-16 DIAGNOSIS — L814 Other melanin hyperpigmentation: Secondary | ICD-10-CM | POA: Diagnosis not present

## 2023-12-16 DIAGNOSIS — L720 Epidermal cyst: Secondary | ICD-10-CM | POA: Diagnosis not present

## 2023-12-18 DIAGNOSIS — A499 Bacterial infection, unspecified: Secondary | ICD-10-CM | POA: Diagnosis not present

## 2023-12-18 DIAGNOSIS — T63481A Toxic effect of venom of other arthropod, accidental (unintentional), initial encounter: Secondary | ICD-10-CM | POA: Diagnosis not present

## 2023-12-18 DIAGNOSIS — M25532 Pain in left wrist: Secondary | ICD-10-CM | POA: Diagnosis not present

## 2023-12-20 DIAGNOSIS — M79642 Pain in left hand: Secondary | ICD-10-CM | POA: Diagnosis not present

## 2023-12-20 DIAGNOSIS — M7989 Other specified soft tissue disorders: Secondary | ICD-10-CM | POA: Diagnosis not present

## 2023-12-24 DIAGNOSIS — M79642 Pain in left hand: Secondary | ICD-10-CM | POA: Diagnosis not present

## 2024-01-07 DIAGNOSIS — I10 Essential (primary) hypertension: Secondary | ICD-10-CM | POA: Diagnosis not present

## 2024-01-07 DIAGNOSIS — Z09 Encounter for follow-up examination after completed treatment for conditions other than malignant neoplasm: Secondary | ICD-10-CM | POA: Diagnosis not present

## 2024-01-07 DIAGNOSIS — D124 Benign neoplasm of descending colon: Secondary | ICD-10-CM | POA: Diagnosis not present

## 2024-01-07 DIAGNOSIS — Z860100 Personal history of colon polyps, unspecified: Secondary | ICD-10-CM | POA: Diagnosis not present

## 2024-01-07 DIAGNOSIS — Z860101 Personal history of adenomatous and serrated colon polyps: Secondary | ICD-10-CM | POA: Diagnosis not present

## 2024-01-07 DIAGNOSIS — K635 Polyp of colon: Secondary | ICD-10-CM | POA: Diagnosis not present

## 2024-01-07 DIAGNOSIS — D122 Benign neoplasm of ascending colon: Secondary | ICD-10-CM | POA: Diagnosis not present

## 2024-01-07 DIAGNOSIS — D123 Benign neoplasm of transverse colon: Secondary | ICD-10-CM | POA: Diagnosis not present

## 2024-01-07 DIAGNOSIS — Z1211 Encounter for screening for malignant neoplasm of colon: Secondary | ICD-10-CM | POA: Diagnosis not present

## 2024-03-20 DIAGNOSIS — F5101 Primary insomnia: Secondary | ICD-10-CM | POA: Diagnosis not present

## 2024-03-20 DIAGNOSIS — M542 Cervicalgia: Secondary | ICD-10-CM | POA: Diagnosis not present

## 2024-03-20 DIAGNOSIS — I10 Essential (primary) hypertension: Secondary | ICD-10-CM | POA: Diagnosis not present

## 2024-03-20 DIAGNOSIS — E7800 Pure hypercholesterolemia, unspecified: Secondary | ICD-10-CM | POA: Diagnosis not present

## 2024-03-20 DIAGNOSIS — R7303 Prediabetes: Secondary | ICD-10-CM | POA: Diagnosis not present

## 2024-03-20 DIAGNOSIS — K219 Gastro-esophageal reflux disease without esophagitis: Secondary | ICD-10-CM | POA: Diagnosis not present

## 2024-03-20 DIAGNOSIS — Z23 Encounter for immunization: Secondary | ICD-10-CM | POA: Diagnosis not present

## 2024-03-20 DIAGNOSIS — C61 Malignant neoplasm of prostate: Secondary | ICD-10-CM | POA: Diagnosis not present

## 2024-04-11 ENCOUNTER — Other Ambulatory Visit: Payer: Self-pay | Admitting: Urology

## 2024-04-11 DIAGNOSIS — C61 Malignant neoplasm of prostate: Secondary | ICD-10-CM

## 2024-04-12 ENCOUNTER — Encounter: Payer: Self-pay | Admitting: Urology

## 2024-06-16 ENCOUNTER — Encounter: Payer: Self-pay | Admitting: Urology

## 2024-06-17 ENCOUNTER — Ambulatory Visit
Admission: RE | Admit: 2024-06-17 | Discharge: 2024-06-17 | Disposition: A | Source: Ambulatory Visit | Attending: Urology

## 2024-06-17 DIAGNOSIS — C61 Malignant neoplasm of prostate: Secondary | ICD-10-CM

## 2024-06-17 MED ORDER — GADOPICLENOL 0.5 MMOL/ML IV SOLN
9.0000 mL | Freq: Once | INTRAVENOUS | Status: AC | PRN
  Administered 2024-06-17: 9 mL via INTRAVENOUS

## 2024-07-06 ENCOUNTER — Other Ambulatory Visit: Payer: Self-pay | Admitting: Cardiovascular Disease
